# Patient Record
Sex: Male | Born: 1960
Health system: Southern US, Community
[De-identification: ages and names within clinical notes are randomized; demographics above are authoritative.]

## PROBLEM LIST (undated history)

## (undated) DIAGNOSIS — E119 Type 2 diabetes mellitus without complications: Secondary | ICD-10-CM

## (undated) DIAGNOSIS — R945 Abnormal results of liver function studies: Secondary | ICD-10-CM

## (undated) DIAGNOSIS — K5790 Diverticulosis of intestine, part unspecified, without perforation or abscess without bleeding: Secondary | ICD-10-CM

## (undated) DIAGNOSIS — F32A Depression, unspecified: Secondary | ICD-10-CM

## (undated) DIAGNOSIS — I10 Essential (primary) hypertension: Secondary | ICD-10-CM

## (undated) DIAGNOSIS — F329 Major depressive disorder, single episode, unspecified: Secondary | ICD-10-CM

## (undated) DIAGNOSIS — K219 Gastro-esophageal reflux disease without esophagitis: Secondary | ICD-10-CM

## (undated) DIAGNOSIS — K760 Fatty (change of) liver, not elsewhere classified: Secondary | ICD-10-CM

## (undated) DIAGNOSIS — K859 Acute pancreatitis without necrosis or infection, unspecified: Secondary | ICD-10-CM

## (undated) DIAGNOSIS — F419 Anxiety disorder, unspecified: Secondary | ICD-10-CM

## (undated) DIAGNOSIS — R7989 Other specified abnormal findings of blood chemistry: Secondary | ICD-10-CM

## (undated) HISTORY — DX: Major depressive disorder, single episode, unspecified: F32.9

## (undated) HISTORY — DX: Depression, unspecified: F32.A

## (undated) HISTORY — DX: Diverticulosis of intestine, part unspecified, without perforation or abscess without bleeding: K57.90

## (undated) HISTORY — DX: Other specified abnormal findings of blood chemistry: R79.89

## (undated) HISTORY — DX: Fatty (change of) liver, not elsewhere classified: K76.0

## (undated) HISTORY — DX: Type 2 diabetes mellitus without complications: E11.9

## (undated) HISTORY — DX: Anxiety disorder, unspecified: F41.9

## (undated) HISTORY — DX: Acute pancreatitis without necrosis or infection, unspecified: K85.90

## (undated) HISTORY — DX: Essential (primary) hypertension: I10

## (undated) HISTORY — DX: Gastro-esophageal reflux disease without esophagitis: K21.9

## (undated) HISTORY — DX: Abnormal results of liver function studies: R94.5

---

## 2004-10-23 ENCOUNTER — Ambulatory Visit: Payer: Self-pay | Admitting: Internal Medicine

## 2005-08-02 ENCOUNTER — Ambulatory Visit: Payer: Self-pay | Admitting: Internal Medicine

## 2005-08-13 ENCOUNTER — Ambulatory Visit: Payer: Self-pay | Admitting: Internal Medicine

## 2005-10-25 ENCOUNTER — Ambulatory Visit: Payer: Self-pay | Admitting: Internal Medicine

## 2005-10-29 ENCOUNTER — Ambulatory Visit: Payer: Self-pay | Admitting: Internal Medicine

## 2005-11-05 ENCOUNTER — Ambulatory Visit: Payer: Self-pay | Admitting: Gastroenterology

## 2005-11-12 ENCOUNTER — Ambulatory Visit: Payer: Self-pay | Admitting: Internal Medicine

## 2005-12-14 ENCOUNTER — Ambulatory Visit: Payer: Self-pay | Admitting: Internal Medicine

## 2006-01-30 ENCOUNTER — Ambulatory Visit: Payer: Self-pay | Admitting: Internal Medicine

## 2006-01-30 ENCOUNTER — Inpatient Hospital Stay (HOSPITAL_COMMUNITY): Admission: AD | Admit: 2006-01-30 | Discharge: 2006-02-02 | Payer: Self-pay | Admitting: Internal Medicine

## 2006-01-31 ENCOUNTER — Ambulatory Visit: Payer: Self-pay | Admitting: Internal Medicine

## 2006-02-05 ENCOUNTER — Ambulatory Visit: Payer: Self-pay | Admitting: Gastroenterology

## 2006-03-11 ENCOUNTER — Ambulatory Visit (HOSPITAL_COMMUNITY): Admission: RE | Admit: 2006-03-11 | Discharge: 2006-03-11 | Payer: Self-pay | Admitting: Gastroenterology

## 2006-03-18 ENCOUNTER — Ambulatory Visit: Payer: Self-pay | Admitting: Internal Medicine

## 2006-03-21 ENCOUNTER — Ambulatory Visit: Payer: Self-pay | Admitting: Internal Medicine

## 2006-06-25 DIAGNOSIS — K859 Acute pancreatitis without necrosis or infection, unspecified: Secondary | ICD-10-CM

## 2006-06-25 HISTORY — DX: Acute pancreatitis without necrosis or infection, unspecified: K85.90

## 2007-05-15 ENCOUNTER — Emergency Department (HOSPITAL_COMMUNITY): Admission: EM | Admit: 2007-05-15 | Discharge: 2007-05-15 | Payer: Self-pay | Admitting: Emergency Medicine

## 2007-07-18 ENCOUNTER — Ambulatory Visit: Payer: Self-pay | Admitting: Internal Medicine

## 2007-07-18 LAB — CONVERTED CEMR LAB
ALT: 31 units/L (ref 0–53)
AST: 32 units/L (ref 0–37)
Albumin: 4.3 g/dL (ref 3.5–5.2)
Amylase: 109 units/L (ref 27–131)
Calcium: 9.2 mg/dL (ref 8.4–10.5)
Chloride: 103 meq/L (ref 96–112)
Eosinophils Relative: 2.7 % (ref 0.0–5.0)
GFR calc non Af Amer: 111 mL/min
HCT: 41.7 % (ref 39.0–52.0)
Hemoglobin: 13.8 g/dL (ref 13.0–17.0)
Lymphocytes Relative: 23.9 % (ref 12.0–46.0)
MCV: 87.6 fL (ref 78.0–100.0)
Neutrophils Relative %: 67.4 % (ref 43.0–77.0)
Sodium: 140 meq/L (ref 135–145)
WBC: 13 10*3/uL — ABNORMAL HIGH (ref 4.5–10.5)

## 2007-09-08 ENCOUNTER — Ambulatory Visit: Payer: Self-pay | Admitting: Internal Medicine

## 2007-11-03 ENCOUNTER — Telehealth: Payer: Self-pay | Admitting: Internal Medicine

## 2007-11-06 ENCOUNTER — Ambulatory Visit: Payer: Self-pay | Admitting: Internal Medicine

## 2007-11-06 DIAGNOSIS — J309 Allergic rhinitis, unspecified: Secondary | ICD-10-CM | POA: Insufficient documentation

## 2007-11-06 DIAGNOSIS — K219 Gastro-esophageal reflux disease without esophagitis: Secondary | ICD-10-CM | POA: Insufficient documentation

## 2007-11-06 DIAGNOSIS — Z8719 Personal history of other diseases of the digestive system: Secondary | ICD-10-CM | POA: Insufficient documentation

## 2007-11-06 DIAGNOSIS — F411 Generalized anxiety disorder: Secondary | ICD-10-CM | POA: Insufficient documentation

## 2007-12-03 ENCOUNTER — Telehealth (INDEPENDENT_AMBULATORY_CARE_PROVIDER_SITE_OTHER): Payer: Self-pay | Admitting: *Deleted

## 2007-12-25 ENCOUNTER — Telehealth: Payer: Self-pay | Admitting: Internal Medicine

## 2007-12-29 ENCOUNTER — Telehealth: Payer: Self-pay | Admitting: Internal Medicine

## 2008-01-14 ENCOUNTER — Telehealth: Payer: Self-pay | Admitting: Internal Medicine

## 2008-01-16 ENCOUNTER — Ambulatory Visit: Payer: Self-pay | Admitting: Internal Medicine

## 2008-01-19 ENCOUNTER — Ambulatory Visit: Payer: Self-pay | Admitting: Cardiology

## 2008-01-19 LAB — CONVERTED CEMR LAB
ALT: 24 units/L (ref 0–53)
AST: 28 units/L (ref 0–37)
Albumin: 4.5 g/dL (ref 3.5–5.2)
Alkaline Phosphatase: 100 units/L (ref 39–117)
BUN: 8 mg/dL (ref 6–23)
Bilirubin Urine: NEGATIVE
Bilirubin, Direct: 0.1 mg/dL (ref 0.0–0.3)
CO2: 28 meq/L (ref 19–32)
Chloride: 101 meq/L (ref 96–112)
Glucose, Bld: 171 mg/dL — ABNORMAL HIGH (ref 70–99)
HCT: 41.2 % (ref 39.0–52.0)
HDL: 33 mg/dL — ABNORMAL LOW (ref >39.0)
Hemoglobin, Urine: NEGATIVE
Ketones, ur: NEGATIVE mg/dL
MCHC: 34.6 g/dL (ref 30.0–36.0)
MCV: 87.6 fL (ref 78.0–100.0)
Monocytes Relative: 7.1 % (ref 3.0–12.0)
Nitrite: NEGATIVE
Platelets: 240 10*3/uL (ref 150–400)
Potassium: 3.8 meq/L (ref 3.5–5.1)
RDW: 12.3 % (ref 11.5–14.6)
Total Protein, Urine: NEGATIVE mg/dL
Total Protein: 7.3 g/dL (ref 6.0–8.3)
Triglycerides: 192 mg/dL — ABNORMAL HIGH (ref 0–149)

## 2008-01-21 ENCOUNTER — Encounter: Payer: Self-pay | Admitting: Gastroenterology

## 2008-01-21 DIAGNOSIS — D490 Neoplasm of unspecified behavior of digestive system: Secondary | ICD-10-CM | POA: Insufficient documentation

## 2008-01-23 ENCOUNTER — Ambulatory Visit: Payer: Self-pay | Admitting: Internal Medicine

## 2008-01-23 LAB — CONVERTED CEMR LAB
Glucose, Bld: 114 mg/dL — ABNORMAL HIGH (ref 70–99)
Hgb A1c MFr Bld: 6 % (ref 4.6–6.0)

## 2008-02-02 ENCOUNTER — Telehealth (INDEPENDENT_AMBULATORY_CARE_PROVIDER_SITE_OTHER): Payer: Self-pay | Admitting: *Deleted

## 2008-02-05 ENCOUNTER — Encounter: Payer: Self-pay | Admitting: Gastroenterology

## 2008-02-05 ENCOUNTER — Ambulatory Visit (HOSPITAL_COMMUNITY): Admission: RE | Admit: 2008-02-05 | Discharge: 2008-02-05 | Payer: Self-pay | Admitting: Gastroenterology

## 2008-02-12 ENCOUNTER — Ambulatory Visit: Payer: Self-pay | Admitting: Gastroenterology

## 2008-02-20 ENCOUNTER — Ambulatory Visit: Payer: Self-pay | Admitting: Internal Medicine

## 2008-02-20 DIAGNOSIS — R109 Unspecified abdominal pain: Secondary | ICD-10-CM | POA: Insufficient documentation

## 2008-02-20 DIAGNOSIS — G4700 Insomnia, unspecified: Secondary | ICD-10-CM | POA: Insufficient documentation

## 2008-02-20 DIAGNOSIS — F17200 Nicotine dependence, unspecified, uncomplicated: Secondary | ICD-10-CM | POA: Insufficient documentation

## 2008-03-25 ENCOUNTER — Telehealth: Payer: Self-pay | Admitting: Internal Medicine

## 2008-03-30 ENCOUNTER — Ambulatory Visit: Payer: Self-pay | Admitting: Internal Medicine

## 2008-06-09 ENCOUNTER — Ambulatory Visit: Payer: Self-pay | Admitting: Internal Medicine

## 2008-06-09 LAB — CONVERTED CEMR LAB
CO2: 31 meq/L (ref 19–32)
Calcium: 9.6 mg/dL (ref 8.4–10.5)
Chloride: 105 meq/L (ref 96–112)
GFR calc non Af Amer: 110 mL/min
Hgb A1c MFr Bld: 5.8 % (ref 4.6–6.0)
Sodium: 141 meq/L (ref 135–145)

## 2008-06-14 ENCOUNTER — Ambulatory Visit: Payer: Self-pay | Admitting: Internal Medicine

## 2008-08-06 ENCOUNTER — Emergency Department (HOSPITAL_COMMUNITY): Admission: EM | Admit: 2008-08-06 | Discharge: 2008-08-06 | Payer: Self-pay | Admitting: Emergency Medicine

## 2008-09-02 ENCOUNTER — Ambulatory Visit: Payer: Self-pay | Admitting: Gastroenterology

## 2008-09-02 ENCOUNTER — Telehealth (INDEPENDENT_AMBULATORY_CARE_PROVIDER_SITE_OTHER): Payer: Self-pay | Admitting: *Deleted

## 2008-09-02 ENCOUNTER — Inpatient Hospital Stay (HOSPITAL_COMMUNITY): Admission: EM | Admit: 2008-09-02 | Discharge: 2008-09-04 | Payer: Self-pay | Admitting: Emergency Medicine

## 2008-09-10 ENCOUNTER — Encounter: Payer: Self-pay | Admitting: Internal Medicine

## 2008-09-13 ENCOUNTER — Ambulatory Visit (HOSPITAL_COMMUNITY): Admission: RE | Admit: 2008-09-13 | Discharge: 2008-09-13 | Payer: Self-pay | Admitting: Internal Medicine

## 2008-12-06 ENCOUNTER — Encounter: Payer: Self-pay | Admitting: Internal Medicine

## 2008-12-13 ENCOUNTER — Ambulatory Visit: Payer: Self-pay | Admitting: Internal Medicine

## 2008-12-13 ENCOUNTER — Telehealth: Payer: Self-pay | Admitting: Internal Medicine

## 2008-12-14 LAB — CONVERTED CEMR LAB
Basophils Absolute: 0 10*3/uL (ref 0.0–0.1)
Bilirubin, Direct: 0.1 mg/dL (ref 0.0–0.3)
CO2: 27 meq/L (ref 19–32)
Calcium: 9 mg/dL (ref 8.4–10.5)
Chloride: 107 meq/L (ref 96–112)
Cholesterol: 204 mg/dL — ABNORMAL HIGH (ref 0–200)
Creatinine, Ser: 0.8 mg/dL (ref 0.4–1.5)
Direct LDL: 143.3 mg/dL
Eosinophils Absolute: 0.2 10*3/uL (ref 0.0–0.7)
Eosinophils Relative: 2 % (ref 0.0–5.0)
HCT: 40 % (ref 39.0–52.0)
HDL: 44.7 mg/dL (ref >39.00)
Hgb A1c MFr Bld: 5.9 % (ref 4.6–6.5)
Ketones, ur: NEGATIVE mg/dL
Leukocytes, UA: NEGATIVE
Lymphs Abs: 3.4 10*3/uL (ref 0.7–4.0)
MCV: 86.7 fL (ref 78.0–100.0)
Monocytes Absolute: 0.8 10*3/uL (ref 0.1–1.0)
Neutrophils Relative %: 51.7 % (ref 43.0–77.0)
Nitrite: NEGATIVE
Platelets: 200 10*3/uL (ref 150.0–400.0)
RDW: 12.2 % (ref 11.5–14.6)
Sodium: 141 meq/L (ref 135–145)
Specific Gravity, Urine: 1.02 (ref 1.000–1.030)
TSH: 1.2 microintl units/mL (ref 0.35–5.50)
Total Bilirubin: 0.7 mg/dL (ref 0.3–1.2)
Triglycerides: 93 mg/dL (ref 0.0–149.0)
Urobilinogen, UA: 0.2 (ref 0.0–1.0)
WBC: 8.6 10*3/uL (ref 4.5–10.5)
pH: 6 (ref 5.0–8.0)

## 2008-12-20 ENCOUNTER — Ambulatory Visit: Payer: Self-pay | Admitting: Internal Medicine

## 2009-03-11 ENCOUNTER — Telehealth: Payer: Self-pay | Admitting: Internal Medicine

## 2009-04-27 ENCOUNTER — Encounter: Payer: Self-pay | Admitting: Internal Medicine

## 2009-07-04 ENCOUNTER — Ambulatory Visit: Payer: Self-pay | Admitting: Internal Medicine

## 2009-07-04 DIAGNOSIS — Z87891 Personal history of nicotine dependence: Secondary | ICD-10-CM | POA: Insufficient documentation

## 2009-09-07 ENCOUNTER — Ambulatory Visit: Payer: Self-pay | Admitting: Internal Medicine

## 2009-09-07 DIAGNOSIS — F418 Other specified anxiety disorders: Secondary | ICD-10-CM | POA: Insufficient documentation

## 2009-12-22 ENCOUNTER — Ambulatory Visit: Payer: Self-pay | Admitting: Internal Medicine

## 2009-12-22 ENCOUNTER — Inpatient Hospital Stay (HOSPITAL_COMMUNITY): Admission: EM | Admit: 2009-12-22 | Discharge: 2009-12-24 | Payer: Self-pay | Admitting: Emergency Medicine

## 2009-12-28 ENCOUNTER — Telehealth (INDEPENDENT_AMBULATORY_CARE_PROVIDER_SITE_OTHER): Payer: Self-pay | Admitting: *Deleted

## 2010-01-05 ENCOUNTER — Ambulatory Visit: Payer: Self-pay | Admitting: Internal Medicine

## 2010-01-05 LAB — CONVERTED CEMR LAB
ALT: 20 units/L (ref 0–53)
Basophils Relative: 1 % (ref 0.0–3.0)
Bilirubin Urine: NEGATIVE
Calcium: 8.9 mg/dL (ref 8.4–10.5)
Cholesterol: 157 mg/dL (ref 0–200)
Eosinophils Absolute: 0.2 10*3/uL (ref 0.0–0.7)
Eosinophils Relative: 2.6 % (ref 0.0–5.0)
GFR calc non Af Amer: 107.69 mL/min (ref >60)
Glucose, Bld: 106 mg/dL — ABNORMAL HIGH (ref 70–99)
HCT: 40.1 % (ref 39.0–52.0)
HDL: 34.1 mg/dL — ABNORMAL LOW (ref >39.00)
Hemoglobin, Urine: NEGATIVE
Ketones, ur: NEGATIVE mg/dL
Leukocytes, UA: NEGATIVE
Lymphs Abs: 2.4 10*3/uL (ref 0.7–4.0)
MCHC: 34.2 g/dL (ref 30.0–36.0)
MCV: 88.8 fL (ref 78.0–100.0)
Monocytes Absolute: 0.6 10*3/uL (ref 0.1–1.0)
PSA: 0.36 ng/mL (ref 0.10–4.00)
Platelets: 246 10*3/uL (ref 150.0–400.0)
Potassium: 4.8 meq/L (ref 3.5–5.1)
Sodium: 137 meq/L (ref 135–145)
TSH: 1.56 microintl units/mL (ref 0.35–5.50)
Total Protein: 7 g/dL (ref 6.0–8.3)
Urobilinogen, UA: 0.2 (ref 0.0–1.0)
WBC: 7 10*3/uL (ref 4.5–10.5)

## 2010-01-11 ENCOUNTER — Ambulatory Visit: Payer: Self-pay | Admitting: Internal Medicine

## 2010-02-15 ENCOUNTER — Telehealth: Payer: Self-pay | Admitting: Internal Medicine

## 2010-05-01 ENCOUNTER — Encounter: Payer: Self-pay | Admitting: Internal Medicine

## 2010-06-06 ENCOUNTER — Telehealth: Payer: Self-pay | Admitting: Internal Medicine

## 2010-06-25 HISTORY — PX: ESOPHAGOGASTRODUODENOSCOPY ENDOSCOPY: SHX5814

## 2010-07-16 ENCOUNTER — Encounter: Payer: Self-pay | Admitting: Internal Medicine

## 2010-07-17 ENCOUNTER — Ambulatory Visit
Admission: RE | Admit: 2010-07-17 | Discharge: 2010-07-17 | Payer: Self-pay | Source: Home / Self Care | Attending: Internal Medicine | Admitting: Internal Medicine

## 2010-07-27 NOTE — Assessment & Plan Note (Signed)
Summary: DEPRESSION--DR AVP PT/NOT IN CLINIC  STC   Vital Signs:  Patient profile:   50 year old male Height:      71 inches Weight:      173 pounds BMI:     24.22 O2 Sat:      97 % on Room air Temp:     98.3 degrees F oral Pulse rate:   71 / minute Pulse rhythm:   regular Resp:     16 per minute BP sitting:   120 / 84  (left arm) Cuff size:   large  Vitals Entered By: Rock Nephew CMA (September 07, 2009 1:18 PM)  O2 Flow:  Room air  Primary Care Provider:  Tresa Garter MD  CC:  Depressive symptoms.  History of Present Illness:  Depressive Symptoms      This is a 50 year old man who presents with Depressive symptoms.  The symptoms began 3 months ago.  The severity is described as mild.  The patient reports depressed mood, loss of interest/pleasure, and insomnia, but denies significant weight loss, significant weight gain, hypersomnia, psychomotor agitation, and psychomotor retardation.  The patient also reports fatigue or loss of energy.  The patient denies feelings of worthlessness, diminished concentration, indecisiveness, thoughts of death, thoughts of suicide, suicidal intent, and suicidal plans.  The patient reports the following psychosocial stressors: recent death of a loved one.  Patient's past history includes depression.  The patient denies abnormally elevated mood, abnormally irritable mood, decreased need for sleep, increased talkativeness, distractibility, flight of ideas, increased goal-directed activity, and inflated self-esteem/ grandiosity.    Preventive Screening-Counseling & Management  Alcohol-Tobacco     Alcohol drinks/day: 0     Smoking Status: quit     Packs/Day: 10-12  Caffeine-Diet-Exercise     Does Patient Exercise: yes  Hep-HIV-STD-Contraception     Hepatitis Risk: no risk noted     HIV Risk: no risk noted     STD Risk: no risk noted      Sexual History:  currently monogamous.        Drug Use:  never.        Blood Transfusions:  no.      Medications Prior to Update: 1)  Xanax 0.5 Mg  Tabs (Alprazolam) .... 1/2 or 1 Tab By Mouth Two Times A Day As Needed Anxiety 2)  Temazepam 30 Mg Caps (Temazepam) .[name].. 1 By Mouth At Bedtime As Needed Insomnia 3)  Vitamin D3 1000 Unit  Tabs (Cholecalciferol) .[name].. 1 By Mouth Daily 4)  Hydrocodone-Acetaminophen 5-500 Mg Tabs (Hydrocodone-Acetaminophen) .[name].. 1-2 Tablets By Mouth Q 4-6 Hours As Needed Pain 5)  Zyrtec Allergy 10 Mg Tabs (Cetirizine Hcl) .... Once Daily 6)  Pancreaze 16109 Unit Cpep (Pancrelipase (Lip-Prot-Amyl)) .[name].. 1-2 By Mouth Three Times A Day W/meals 7)  Omeprazole 40 Mg Cpdr (Omeprazole) .[name].. 1 By Mouth Qam For Indigestion Prn  Current Medications (verified): 1)  Xanax 0.5 Mg  Tabs (Alprazolam) .... 1/2 or 1 Tab By Mouth Two Times A Day As Needed Anxiety 2)  Temazepam 30 Mg Caps (Temazepam) .[name].. 1 By Mouth At Bedtime As Needed Insomnia 3)  Vitamin D3 1000 Unit  Tabs (Cholecalciferol) .[name].. 1 By Mouth Daily 4)  Hydrocodone-Acetaminophen 5-500 Mg Tabs (Hydrocodone-Acetaminophen) .[name].. 1-2 Tablets By Mouth Q 4-6 Hours As Needed Pain 5)  Zyrtec Allergy 10 Mg Tabs (Cetirizine Hcl) .... Once Daily 6)  Pancreaze 60454 Unit Cpep (Pancrelipase (Lip-Prot-Amyl)) .[name].. 1-2 By Mouth Three Times A Day W/meals As  Needed 7)  Omeprazole 40 Mg Cpdr (Omeprazole) .[name].. 1 By Mouth Qam For Indigestion Prn 8)  Paroxetine Hcl 20 Mg Tabs (Paroxetine Hcl) .... One By Mouth Qday For Depression  Allergies (verified): No Known Drug Allergies  Past History:  Past Medical History: Reviewed history from 11/06/2007 and no changes required. Pancreatitis, hx of x2  2008  ? cause Allergic rhinitis Anxiety GERD  Past Surgical History: Denies surgical history  Family History: Reviewed history from 11/06/2007 and no changes required. Family History of Anxiety No CAD  Social History: Reviewed history from 11/06/2007 and no changes required. Occupation: Producer, television/film/video Alcohol  use-no Divorced 2 daughters Regular exercise-yes Hepatitis Risk:  no risk noted HIV Risk:  no risk noted STD Risk:  no risk noted Sexual History:  currently monogamous Drug Use:  never Blood Transfusions:  no Does Patient Exercise:  yes  Review of Systems  The patient denies anorexia, fever, weight loss, chest pain, peripheral edema, prolonged cough, and abdominal pain.   GI:  Denies abdominal pain, change in bowel habits, loss of appetite, nausea, and vomiting.  Physical Exam  General:  Well-developed,well-nourished,in no acute distress; alert,appropriate and cooperative throughout examination Head:  normocephalic, atraumatic, no abnormalities observed, and no abnormalities palpated.   Eyes:  no icterus Mouth:  Oral mucosa and oropharynx without lesions or exudates.  Teeth in good repair. Neck:  supple, full ROM, no masses, no thyromegaly, no thyroid nodules or tenderness, and no JVD.   Lungs:  Normal respiratory effort, chest expands symmetrically. Lungs are clear to auscultation, no crackles or wheezes. Heart:  Normal rate and regular rhythm. S1 and S2 normal without gallop, murmur, click, rub or other extra sounds. Abdomen:  Bowel sounds positive,abdomen soft and non-tender without masses, organomegaly or hernias noted. Msk:  No deformity or scoliosis noted of thoracic or lumbar spine.   Extremities:  No clubbing, cyanosis, edema, or deformity noted with normal full range of motion of all joints.   Neurologic:  No cranial nerve deficits noted. Station and gait are normal. Plantar reflexes are down-going bilaterally. DTRs are symmetrical throughout. Sensory, motor and coordinative functions appear intact. Skin:  Intact without suspicious lesions or rashes Cervical Nodes:  no anterior cervical adenopathy and no posterior cervical adenopathy.   Axillary Nodes:  no R axillary adenopathy and no L axillary adenopathy.   Inguinal Nodes:  no R inguinal adenopathy and no L inguinal  adenopathy.   Psych:  Oriented X3, memory intact for recent and remote, normally interactive, good eye contact, not anxious appearing, not agitated, not suicidal, not homicidal, and flat affect.     Impression & Recommendations:  Problem # 1:  DEPRESSIVE DISORDER (ICD-311) Assessment New  he will try to taper his use of benzo's and opioids as these may be contributing to the depression, if needed for insomnia i would add seroquel at hs , he will continue psychotherapy with Dr. Charise Killian His updated medication list for this problem includes:    Xanax 0.5 Mg Tabs (Alprazolam) .[name]... 1/2 or 1 tab by mouth two times a day as needed anxiety    Paroxetine Hcl 20 Mg Tabs (Paroxetine hcl) ..... One by mouth qday for depression  Discussed treatment options, including trial of antidpressant medication. Will refer to behavioral health. Follow-up call in in 24-48 hours and recheck in 2 weeks, sooner as needed. Patient agrees to call if any worsening of symptoms or thoughts of doing harm arise. Verified that the patient has no suicidal ideation  at this time.   Problem # 2:  PANCREATITIS, HX OF (ICD-V12.70) Assessment: Improved  Complete Medication List: 1)  Xanax 0.5 Mg Tabs (Alprazolam) .... 1/2 or 1 tab by mouth two times a day as needed anxiety 2)  Temazepam 30 Mg Caps (Temazepam) .[name].. 1 by mouth at bedtime as needed insomnia 3)  Vitamin D3 1000 Unit Tabs (Cholecalciferol) .[name].. 1 by mouth daily 4)  Hydrocodone-acetaminophen 5-500 Mg Tabs (Hydrocodone-acetaminophen) .[name].. 1-2 tablets by mouth q 4-6 hours as needed pain 5)  Zyrtec Allergy 10 Mg Tabs (Cetirizine hcl) .... Once daily 6)  Pancreaze 84696 Unit Cpep (Pancrelipase (lip-prot-amyl)) .[name].. 1-2 by mouth three times a day w/meals as needed 7)  Omeprazole 40 Mg Cpdr (Omeprazole) .[name].. 1 by mouth qam for indigestion prn 8)  Paroxetine Hcl 20 Mg Tabs (Paroxetine hcl) .... One by mouth qday for depression  Patient Instructions: 1)  Please  schedule a follow-up appointment in 1 month. Please try to taper off the xanax and temazepam.  Prescriptions: PAROXETINE HCL 20 MG TABS (PAROXETINE HCL) One by mouth qday for depression  #30 x 11   Entered and Authorized by:   Etta Grandchild MD   Signed by:   Etta Grandchild MD on 09/07/2009   Method used:   Electronically to        [facility] [address] [phone]* (retail)       [address]       [place], [place]  [address]       Ph: [phone] or [phone]       Fax: [phone]   RxID:   [id number]

## 2010-07-27 NOTE — Assessment & Plan Note (Signed)
Summary: 6 mos f/u #/cd   Vital Signs:  Patient profile:   50 year old male Height:      71 inches Weight:      173 pounds BMI:     24.22 Temp:     98.6 degrees F oral Pulse rate:   80 / minute Pulse rhythm:   regular Resp:     16 per minute BP sitting:   120 / 90  (left arm) Cuff size:   regular  Vitals Entered By: Lanier Prude, CMA(AAMA) (July 17, 2010 9:56 AM) CC: 6 mo f/u  Is Patient Diabetic? No   Primary Care Provider:  Tresa Garter MD  CC:  6 mo f/u .  History of Present Illness: The patient presents for a follow up of pancreatitis, anxiety, smoking, insomnia. Enzymes help. Started to smoke - now and quit   Current Medications (verified): 1)  Xanax 0.5 Mg  Tabs (Alprazolam) .... 1/2 or 1 Tab By Mouth Two Times A Day As Needed Anxiety 2)  Temazepam 30 Mg Caps (Temazepam) .[name].. 1 By Mouth At Bedtime As Needed Insomnia 3)  Vitamin D3 1000 Unit  Tabs (Cholecalciferol) .[name].. 1 By Mouth Daily 4)  Hydrocodone-Acetaminophen 5-500 Mg Tabs (Hydrocodone-Acetaminophen) .[name].. 1-2 Tablets By Mouth Q 4-6 Hours As Needed Pain 5)  Zyrtec Allergy 10 Mg Tabs (Cetirizine Hcl) .... Once Daily 6)  Omeprazole 40 Mg Cpdr (Omeprazole) .[name].. 1 By Mouth Qam For Indigestion Prn 7)  Buspirone Hcl 15 Mg Tabs (Buspirone Hcl) .... Start With 1/2 Tab Two Times A Day Then 1 By Mouth Two Times A Day For Anxiety 8)  Promethazine Hcl 25 Mg Tabs (Promethazine Hcl) .[name].. 1-2 By Mouth Four Times A Day As Needed Nausea 9)  Zenpep 10000 Unit Cpep (Pancrelipase (Lip-Prot-Amyl)) .[name].. 1 By Mouth Three Times A Day Ac 10)  Chantix Continuing Month Pak 1 Mg Tabs (Varenicline Tartrate) .... As Dirrected  Allergies (verified): No Known Drug Allergies  Past History:  Past Medical History: Last updated: 11/06/2007 Pancreatitis, hx of x2  2008  ? cause Allergic rhinitis Anxiety GERD  Social History: Last updated: 09/07/2009 Occupation: [employer] Alcohol use-no Divorced  2 daughters Regular exercise-yes  Review of Systems  The patient denies anorexia, fever, weight loss, weight gain, vision loss, decreased hearing, hoarseness, chest pain, syncope, dyspnea on exertion, peripheral edema, prolonged cough, headaches, hemoptysis, abdominal pain, melena, hematochezia, severe indigestion/heartburn, hematuria, incontinence, genital sores, muscle weakness, suspicious skin lesions, transient blindness, difficulty walking, depression, unusual weight change, abnormal bleeding, enlarged lymph nodes, angioedema, and testicular masses.    Physical Exam  General:  Well-developed,well-nourished,in no acute distress; alert,appropriate and cooperative throughout examination Head:  normocephalic, atraumatic, no abnormalities observed, and no abnormalities palpated.   Mouth:  Oral mucosa and oropharynx without lesions or exudates.  Teeth in good repair. Neck:  No deformities, masses, or tenderness noted. Lungs:  Normal respiratory effort, chest expands symmetrically. Lungs are clear to auscultation, no crackles or wheezes. Heart:  Normal rate and regular rhythm. S1 and S2 normal without gallop, murmur, click, rub or other extra sounds. Abdomen:  Bowel sounds positive,abdomen soft and non-tender without masses, organomegaly or hernias noted. Msk:  No deformity or scoliosis noted of thoracic or lumbar spine.   Neurologic:  No cranial nerve deficits noted. Station and gait are normal. Plantar reflexes are down-going bilaterally. DTRs are symmetrical throughout. Sensory, motor and coordinative functions appear intact. Skin:  Intact without suspicious lesions or rashes Psych:  Cognition and judgment  appear intact. Alert and cooperative with normal attention span and concentration. No apparent delusions, illusions, hallucinations   Impression & Recommendations:  Problem # 1:  ANXIETY (ICD-300.00) Assessment Improved  His updated medication list for this problem includes:    Xanax  0.5 Mg Tabs (Alprazolam) .[name]... 1/2 or 1 tab by mouth two times a day as needed anxiety    Buspirone Hcl 15 Mg Tabs (Buspirone hcl) ..... Start with 1/2 tab two times a day then 1 by mouth two times a day for anxiety  Problem # 2:  TOBACCO USE, QUIT (ICD-V15.82) Assessment: Improved Chantix to cont  Problem # 3:  DEPRESSIVE DISORDER (ICD-311) Assessment: Improved  His updated medication list for this problem includes:    Xanax 0.5 Mg Tabs (Alprazolam) .[name]... 1/2 or 1 tab by mouth two times a day as needed anxiety    Buspirone Hcl 15 Mg Tabs (Buspirone hcl) ..... Start with 1/2 tab two times a day then 1 by mouth two times a day for anxiety  Problem # 4:  PANCREATITIS, HX OF (ICD-V12.70) Assessment: Improved Cont on enzymes  Complete Medication List: 1)  Xanax 0.5 Mg Tabs (Alprazolam) .... 1/2 or 1 tab by mouth two times a day as needed anxiety 2)  Temazepam 30 Mg Caps (Temazepam) .[name].. 1 by mouth at bedtime as needed insomnia 3)  Vitamin D3 1000 Unit Tabs (Cholecalciferol) .[name].. 1 by mouth daily 4)  Hydrocodone-acetaminophen 5-500 Mg Tabs (Hydrocodone-acetaminophen) .[name].. 1-2 tablets by mouth q 4-6 hours as needed pain 5)  Zyrtec Allergy 10 Mg Tabs (Cetirizine hcl) .... Once daily 6)  Omeprazole 40 Mg Cpdr (Omeprazole) .[name].. 1 by mouth qam for indigestion prn 7)  Buspirone Hcl 15 Mg Tabs (Buspirone hcl) .... Start with 1/2 tab two times a day then 1 by mouth two times a day for anxiety 8)  Promethazine Hcl 25 Mg Tabs (Promethazine hcl) .[name].. 1-2 by mouth four times a day as needed nausea 9)  Zenpep 10000 Unit Cpep (Pancrelipase (lip-prot-amyl)) .[name].. 1 by mouth three times a day ac 10)  Chantix Continuing Month Pak 1 Mg Tabs (Varenicline tartrate) .... As dirrected  Patient Instructions: 1)  Please schedule a follow-up appointment in 6 months well w/labs. Prescriptions: OMEPRAZOLE 40 MG CPDR (OMEPRAZOLE) 1 by mouth qam for indigestion prn  #90 x 3   Entered and Authorized by:   Tresa Garter MD   Signed by:   Tresa Garter MD on 07/17/2010   Method used:   Print then Give to Patient   RxID:   [id number] ZENPEP 10000 UNIT CPEP (PANCRELIPASE (LIP-PROT-AMYL)) 1 by mouth three times a day ac  #120 x 11   Entered and Authorized by:   Tresa Garter MD   Signed by:   Tresa Garter MD on 07/17/2010   Method used:   Print then Give to Patient   RxID:   [id number] BUSPIRONE HCL 15 MG TABS (BUSPIRONE HCL) Start with 1/2 tab two times a day then 1 by mouth two times a day for anxiety  #180 x 1   Entered and Authorized by:   Tresa Garter MD   Signed by:   Tresa Garter MD on 07/17/2010   Method used:   Print then Give to Patient   RxID:   [id number] TEMAZEPAM 30 MG CAPS (TEMAZEPAM) 1 by mouth at bedtime as needed insomnia  #90 x 1   Entered and Authorized by:   Tresa Garter MD  Signed by:   Tresa Garter MD on 07/17/2010   Method used:   Print then Give to Patient   RxID:   [id number] XANAX 0.5 MG  TABS (ALPRAZOLAM) 1/2 or 1 tab by mouth two times a day as needed anxiety  #180 x 1   Entered and Authorized by:   Tresa Garter MD   Signed by:   Tresa Garter MD on 07/17/2010   Method used:   Print then Give to Patient   RxID:   [id number]    Orders Added: 1)  Est. Patient Level IV [84696]

## 2010-07-27 NOTE — Miscellaneous (Signed)
  Clinical Lists Changes  Observations: Added new observation of FLU VAX: Historical (04/27/2010 16:40)        Immunization History:  Influenza Immunization History:    Influenza:  historical (04/27/2010)

## 2010-07-27 NOTE — Progress Notes (Signed)
Summary: rx request  Phone Note Call from Patient Call back at [phone]   Summary of Call: Patient called c/o head cold/sinus infection and would like to know if ABX can sent to his pharmacy. He uses [facility] on [address]. Please advise. Initial call taken by: Lucious Groves CMA,  February 15, 2010 11:37 AM  Follow-up for Phone Call        Patient called back requesting the status of his request, I made him aware that our office would call him once the MD has made his recommendation. Follow-up by: Lucious Groves CMA,  February 15, 2010 4:36 PM  Additional Follow-up for Phone Call Additional follow up Details #1::        ok Zpac OV if sick Additional Follow-up by: Tresa Garter MD,  February 15, 2010 5:59 PM    Additional Follow-up for Phone Call Additional follow up Details #2::    pt informed Follow-up by: Margaret Pyle, CMA,  February 16, 2010 8:39 AM  New/Updated Medications: ZITHROMAX Z-PAK 250 MG TABS (AZITHROMYCIN) as dirrected Prescriptions: ZITHROMAX Z-PAK 250 MG TABS (AZITHROMYCIN) as dirrected  #1 x 0   Entered and Authorized by:   Tresa Garter MD   Signed by:   Tresa Garter MD on 02/15/2010   Method used:   Electronically to        [facility] [address] [phone]* (retail)       [address]       [place], [place]  [address]       Ph: [phone] or [phone]       Fax: [phone]   RxID:   [id number]

## 2010-07-27 NOTE — Progress Notes (Signed)
Summary: Alprazolam Rf  Phone Note Refill Request Message from:  Pharmacy  Refills Requested: Medication #1:  XANAX 0.5 MG  TABS 1/2 or 1 tab by mouth two times a day as needed anxiety   Dosage confirmed as above?Dosage Confirmed   Supply Requested: 60   Last Refilled: 05/01/2010  Method Requested: Telephone to Pharmacy Next Appointment Scheduled: 07/17/10 Initial call taken by: Lanier Prude, [facility]),  June 06, 2010 9:45 AM  Follow-up for Phone Call        ok 3 ref Follow-up by: Tresa Garter MD,  June 06, 2010 4:04 PM  Additional Follow-up for Phone Call Additional follow up Details #1::        Rx called to pharmacy Additional Follow-up by: Lanier Prude, [facility]),  June 07, 2010 1:42 PM    Prescriptions: Prudy Feeler 0.5 MG  TABS (ALPRAZOLAM) 1/2 or 1 tab by mouth two times a day as needed anxiety  #60 x 2   Entered by:   Lanier Prude, [facility])   Authorized by:   Tresa Garter MD   Signed by:   Lanier Prude, CMA(AAMA) on 06/07/2010   Method used:   Telephoned to ...       [facility] [address] [phone]* (retail)       [address]       [place], [place]  [address]       Ph: [phone] or [phone]       Fax: [phone]   RxID:   [id number]

## 2010-07-27 NOTE — Assessment & Plan Note (Signed)
Summary: cpx/bcbx/#/cd   Vital Signs:  Patient profile:   50 year old male Height:      71 inches (180.34 cm) Weight:      166 pounds (75.45 kg) BMI:     23.24 O2 Sat:      97 % on Room air Temp:     97.5 degrees F (36.39 degrees C) oral Pulse rate:   57 / minute Pulse rhythm:   regular Resp:     16 per minute BP sitting:   100 / 70  (left arm) Cuff size:   regular  Vitals Entered By: Lanier Prude, CMA(AAMA) (January 11, 2010 10:33 AM)  O2 Flow:  Room air CC: CPX  Is Patient Diabetic? No Comments pt is not taking Zyrtec, Pancreaze, Omeprazole or Paroxetine   Primary Care Provider:  Tresa Garter MD  CC:  CPX .  History of Present Illness: The patient presents for a wellness examination   Current Medications (verified): 1)  Xanax 0.5 Mg  Tabs (Alprazolam) .... 1/2 or 1 Tab By Mouth Two Times A Day As Needed Anxiety 2)  Temazepam 30 Mg Caps (Temazepam) .[name].. 1 By Mouth At Bedtime As Needed Insomnia 3)  Vitamin D3 1000 Unit  Tabs (Cholecalciferol) .[name].. 1 By Mouth Daily 4)  Hydrocodone-Acetaminophen 5-500 Mg Tabs (Hydrocodone-Acetaminophen) .[name].. 1-2 Tablets By Mouth Q 4-6 Hours As Needed Pain 5)  Zyrtec Allergy 10 Mg Tabs (Cetirizine Hcl) .... Once Daily 6)  Pancreaze 40981 Unit Cpep (Pancrelipase (Lip-Prot-Amyl)) .[name].. 1-2 By Mouth Three Times A Day W/meals As Needed 7)  Omeprazole 40 Mg Cpdr (Omeprazole) .[name].. 1 By Mouth Qam For Indigestion Prn 8)  Paroxetine Hcl 20 Mg Tabs (Paroxetine Hcl) .... One By Mouth Qday For Depression  Allergies (verified): No Known Drug Allergies  Past History:  Past Medical History: Last updated: 11/06/2007 Pancreatitis, hx of x2  2008  ? cause Allergic rhinitis Anxiety GERD  Past Surgical History: Last updated: 09/07/2009 Denies surgical history  Family History: Last updated: 11/06/2007 Family History of Anxiety No CAD  Social History: Last updated: 09/07/2009 Occupation: Scientist, research (medical) Domestic Partner Michael Alcohol  use-no Divorced 2 daughters Regular exercise-yes  Review of Systems       The patient complains of abdominal pain.  The patient denies anorexia, fever, weight loss, weight gain, vision loss, decreased hearing, hoarseness, chest pain, syncope, dyspnea on exertion, peripheral edema, prolonged cough, headaches, hemoptysis, melena, hematochezia, severe indigestion/heartburn, hematuria, incontinence, genital sores, muscle weakness, suspicious skin lesions, transient blindness, difficulty walking, depression, unusual weight change, abnormal bleeding, enlarged lymph nodes, angioedema, and testicular masses.    Physical Exam  General:  Well-developed,well-nourished,in no acute distress; alert,appropriate and cooperative throughout examination Head:  normocephalic, atraumatic, no abnormalities observed, and no abnormalities palpated.   Eyes:  no icterus Ears:  External ear exam shows no significant lesions or deformities.  Otoscopic examination reveals clear canals, tympanic membranes are intact bilaterally without bulging, retraction, inflammation or discharge. Hearing is grossly normal bilaterally. Nose:  External nasal examination shows no deformity or inflammation. Nasal mucosa are pink and moist without lesions or exudates. Mouth:  Oral mucosa and oropharynx without lesions or exudates.  Teeth in good repair. Neck:  No deformities, masses, or tenderness noted. Chest Wall:  No deformities, masses, tenderness or gynecomastia noted. Lungs:  Normal respiratory effort, chest expands symmetrically. Lungs are clear to auscultation, no crackles or wheezes. Heart:  Normal rate and regular rhythm. S1 and S2 normal without gallop, murmur, click, rub or other extra sounds.  Abdomen:  Bowel sounds positive,abdomen soft and non-tender without masses, organomegaly or hernias noted. Genitalia:  Testes bilaterally descended without nodularity, tenderness or masses. No scrotal masses or lesions. No penis lesions or  urethral discharge. Msk:  No deformity or scoliosis noted of thoracic or lumbar spine.   Pulses:  R and L carotid,radial,femoral,dorsalis pedis and posterior tibial pulses are full and equal bilaterally Extremities:  No clubbing, cyanosis, edema, or deformity noted with normal full range of motion of all joints.   Neurologic:  No cranial nerve deficits noted. Station and gait are normal. Plantar reflexes are down-going bilaterally. DTRs are symmetrical throughout. Sensory, motor and coordinative functions appear intact. Skin:  Intact without suspicious lesions or rashes Cervical Nodes:  No lymphadenopathy noted Inguinal Nodes:  No significant adenopathy Psych:  Cognition and judgment appear intact. Alert and cooperative with normal attention span and concentration. No apparent delusions, illusions, hallucinations   Impression & Recommendations:  Problem # 1:  HEALTH MAINTENANCE EXAM (ICD-V70.0) Assessment New Health and age related issues were discussed. Available screening tests and vaccinations were discussed as well. Healthy life style including good diet and execise was discussed.  The labs were reviewed with the patient.  Orders: EKG w/ Interpretation (93000)WNL  Problem # 2:  TOBACCO USE, QUIT (ICD-V15.82) Assessment: Comment Only Quit again  Problem # 3:  PANCREATITIS, HX OF (ICD-V12.70) Assessment: Unchanged  Complete Medication List: 1)  Xanax 0.5 Mg Tabs (Alprazolam) .... 1/2 or 1 tab by mouth two times a day as needed anxiety 2)  Temazepam 30 Mg Caps (Temazepam) .[name].. 1 by mouth at bedtime as needed insomnia 3)  Vitamin D3 1000 Unit Tabs (Cholecalciferol) .[name].. 1 by mouth daily 4)  Hydrocodone-acetaminophen 5-500 Mg Tabs (Hydrocodone-acetaminophen) .[name].. 1-2 tablets by mouth q 4-6 hours as needed pain 5)  Zyrtec Allergy 10 Mg Tabs (Cetirizine hcl) .... Once daily 6)  Omeprazole 40 Mg Cpdr (Omeprazole) .[name].. 1 by mouth qam for indigestion prn 7)  Buspirone Hcl 15 Mg Tabs  (Buspirone hcl) .... Start with 1/2 tab two times a day then 1 by mouth two times a day for anxiety 8)  Promethazine Hcl 25 Mg Tabs (Promethazine hcl) .[name].. 1-2 by mouth four times a day as needed nausea 9)  Zenpep 10000 Unit Cpep (Pancrelipase (lip-prot-amyl)) .[name].. 1 by mouth three times a day ac 10)  Chantix Continuing Month Pak 1 Mg Tabs (Varenicline tartrate) .... As dirrected  Patient Instructions: 1)  Please schedule a follow-up appointment in 6 months. Prescriptions: OMEPRAZOLE 40 MG CPDR (OMEPRAZOLE) 1 by mouth qam for indigestion prn  #90 x 3   Entered and Authorized by:   Tresa Garter MD   Signed by:   Tresa Garter MD on 01/11/2010   Method used:   Electronically to        [facility] [address] [phone]* (retail)       [address]       [place], [place]  [address]       Ph: [phone] or [phone]       Fax: [phone]   RxID:   [id number] CHANTIX CONTINUING MONTH PAK 1 MG TABS (VARENICLINE TARTRATE) as dirrected  #1 x 5   Entered and Authorized by:   Tresa Garter MD   Signed by:   Tresa Garter MD on 01/11/2010   Method used:   Print then Give to Patient   RxID:   [id number] HYDROCODONE-ACETAMINOPHEN  5-500 MG TABS (HYDROCODONE-ACETAMINOPHEN) 1-2 tablets by mouth q 4-6 hours as needed pain  #60 x 3   Entered and Authorized by:   Tresa Garter MD   Signed by:   Tresa Garter MD on 01/11/2010   Method used:   Print then Give to Patient   RxID:   [id number] TEMAZEPAM 30 MG CAPS (TEMAZEPAM) 1 by mouth at bedtime as needed insomnia  #30 x 6   Entered and Authorized by:   Tresa Garter MD   Signed by:   Tresa Garter MD on 01/11/2010   Method used:   Print then Give to Patient   RxID:   [id number] XANAX 0.5 MG  TABS (ALPRAZOLAM) 1/2 or 1 tab by mouth two times a day as needed anxiety  #60 x 3   Entered and Authorized by:   Tresa Garter MD   Signed by:   Tresa Garter MD  on 01/11/2010   Method used:   Print then Give to Patient   RxID:   [id number] ZENPEP 10000 UNIT CPEP (PANCRELIPASE (LIP-PROT-AMYL)) 1 by mouth three times a day ac  #90 x 12   Entered and Authorized by:   Tresa Garter MD   Signed by:   Tresa Garter MD on 01/11/2010   Method used:   Print then Give to Patient   RxID:   [id number] PROMETHAZINE HCL 25 MG TABS (PROMETHAZINE HCL) 1-2 by mouth four times a day as needed nausea  #60 x 1   Entered and Authorized by:   Tresa Garter MD   Signed by:   Tresa Garter MD on 01/11/2010   Method used:   Electronically to        [facility] [address] [phone]* (retail)       [address]       [place], [place]  [address]       Ph: [phone] or [phone]       Fax: [phone]   RxID:   [id number] BUSPIRONE HCL 15 MG TABS (BUSPIRONE HCL) Start with 1/2 tab two times a day then 1 by mouth two times a day for anxiety  #60 x 6   Entered and Authorized by:   Tresa Garter MD   Signed by:   Tresa Garter MD on 01/11/2010   Method used:   Electronically to        [facility] [address] [phone]* (retail)       [address]       [place], [place]  [address]       Ph: [phone] or [phone]       Fax: [phone]   RxID:   [id number]

## 2010-07-27 NOTE — Assessment & Plan Note (Signed)
Summary: 6 MO ROV /NWS $50   Vital Signs:  Patient profile:   50 year old male Weight:      174 pounds Temp:     98.4 degrees F oral Pulse rate:   95 / minute BP sitting:   120 / 86  (left arm)  Vitals Entered By: Tora Perches (July 04, 2009 9:56 AM) CC: f/u Is Patient Diabetic? No   CC:  f/u.  History of Present Illness: F/u pancreatitis, abd pain, anxiety. Stomach hurts after fatty meals  Preventive Screening-Counseling & Management  Alcohol-Tobacco     Smoking Status: quit  Current Medications (verified): 1)  Xanax 0.5 Mg  Tabs (Alprazolam) .... 1/2 or 1 Tab By Mouth Two Times A Day As Needed Anxiety 2)  Temazepam 30 Mg Caps (Temazepam) .[name].. 1 By Mouth At Bedtime As Needed Insomnia 3)  Vitamin D3 1000 Unit  Tabs (Cholecalciferol) .[name].. 1 By Mouth Daily 4)  Hydrocodone-Acetaminophen 5-500 Mg Tabs (Hydrocodone-Acetaminophen) .[name].. 1-2 Tablets By Mouth Q 4-6 Hours As Needed Pain 5)  Zyrtec Allergy 10 Mg Tabs (Cetirizine Hcl) .... Once Daily  Allergies (verified): No Known Drug Allergies  Past History:  Past Medical History: Last updated: 11/06/2007 Pancreatitis, hx of x2  2008  ? cause Allergic rhinitis Anxiety GERD  Family History: Last updated: 11/06/2007 Family History of Anxiety No CAD  Social History: Last updated: 11/06/2007 Occupation: Scientist, research (medical) Domestic Partner Michael Current Smoker Alcohol use-no Divorced 2 daughters  Social History: Smoking Status:  quit  Physical Exam  General:  Well-developed,well-nourished,in no acute distress; alert,appropriate and cooperative throughout examination Nose:  External nasal examination shows no deformity or inflammation. Nasal mucosa are pink and moist without lesions or exudates. Mouth:  Oral mucosa and oropharynx without lesions or exudates.  Teeth in good repair. Lungs:  Normal respiratory effort, chest expands symmetrically. Lungs are clear to auscultation, no crackles or wheezes. Heart:  Normal  rate and regular rhythm. S1 and S2 normal without gallop, murmur, click, rub or other extra sounds. Abdomen:  Bowel sounds positive,abdomen soft and non-tender without masses, organomegaly or hernias noted. Msk:  No deformity or scoliosis noted of thoracic or lumbar spine.   Neurologic:  No cranial nerve deficits noted. Station and gait are normal. Plantar reflexes are down-going bilaterally. DTRs are symmetrical throughout. Sensory, motor and coordinative functions appear intact. Skin:  Intact without suspicious lesions or rashes Psych:  Cognition and judgment appear intact. Alert and cooperative with normal attention span and concentration. No apparent delusions, illusions, hallucinations   Impression & Recommendations:  Problem # 1:  ABDOMINAL PAIN (ICD-789.00) occasional Assessment Unchanged Creon prn  Problem # 2:  GERD (ICD-530.81) Assessment: Comment Only  His updated medication list for this problem includes:    Omeprazole 40 Mg Cpdr (Omeprazole) .[name]... 1 by mouth qam for indigestion prn  Problem # 3:  PANCREATITIS, HX OF (ICD-V12.70) Assessment: Comment Only  Problem # 4:  ABDOMINAL PAIN (ICD-789.00) Assessment: Comment Only  Complete Medication List: 1)  Xanax 0.5 Mg Tabs (Alprazolam) .... 1/2 or 1 tab by mouth two times a day as needed anxiety 2)  Temazepam 30 Mg Caps (Temazepam) .[name].. 1 by mouth at bedtime as needed insomnia 3)  Vitamin D3 1000 Unit Tabs (Cholecalciferol) .[name].. 1 by mouth daily 4)  Hydrocodone-acetaminophen 5-500 Mg Tabs (Hydrocodone-acetaminophen) .[name].. 1-2 tablets by mouth q 4-6 hours as needed pain 5)  Zyrtec Allergy 10 Mg Tabs (Cetirizine hcl) .... Once daily 6)  Pancreaze 21308 Unit Cpep (Pancrelipase (lip-prot-amyl)) .[name].. 1-2  by mouth three times a day w/meals 7)  Omeprazole 40 Mg Cpdr (Omeprazole) .[name].. 1 by mouth qam for indigestion prn  Patient Instructions: 1)  Try Kefir 2)  Try Creon samples as needed  3)  Please schedule a follow-up  appointment in 6 months well w/labs v70.0. Prescriptions: OMEPRAZOLE 40 MG CPDR (OMEPRAZOLE) 1 by mouth qam for indigestion prn  #90 x 3   Entered and Authorized by:   Tresa Garter MD   Signed by:   Tresa Garter MD on 07/04/2009   Method used:   Print then Give to Patient   RxID:   [id number] HYDROCODONE-ACETAMINOPHEN 5-500 MG TABS (HYDROCODONE-ACETAMINOPHEN) 1-2 tablets by mouth q 4-6 hours as needed pain  #60 x 3   Entered and Authorized by:   Tresa Garter MD   Signed by:   Tresa Garter MD on 07/04/2009   Method used:   Print then Give to Patient   RxID:   [id number] TEMAZEPAM 30 MG CAPS (TEMAZEPAM) 1 by mouth at bedtime as needed insomnia  #30 x 6   Entered and Authorized by:   Tresa Garter MD   Signed by:   Tresa Garter MD on 07/04/2009   Method used:   Print then Give to Patient   RxID:   [id number] XANAX 0.5 MG  TABS (ALPRAZOLAM) 1/2 or 1 tab by mouth two times a day as needed anxiety  #60 x 3   Entered and Authorized by:   Tresa Garter MD   Signed by:   Tresa Garter MD on 07/04/2009   Method used:   Print then Give to Patient   RxID:   [id number] PANCREAZE 38756 UNIT CPEP (PANCRELIPASE (LIP-PROT-AMYL)) 1-2 by mouth three times a day w/meals  #120 x 12   Entered and Authorized by:   Tresa Garter MD   Signed by:   Tresa Garter MD on 07/04/2009   Method used:   Print then Give to Patient   RxID:   [id number]

## 2010-07-27 NOTE — Progress Notes (Signed)
Summary: Followup  Phone Note Call from Patient   Summary of Call: Pt. was in hospital for pancreatitis. Seen by Dr. Juanda Chance and Gunnar Fusi.Has f/u with Dr.Plotnikov on 01/11/10 with blood work ordered prior to visit.Discharge instructiions say f/u with Dr.Perry in 4 weeks..Asking if Dr.Perry wants him to come in or review Dr.Plotinikov's visit and the labs? Initial call taken by: Teryl Lucy RN,  December 28, 2009 9:57 AM  Follow-up for Phone Call        see how he is feeling. Let him know that I reviewed his hospital record and x-rays. I would be happy to see him for routine followup or wait, if he is doing better. Let me know. Follow-up by: Hilarie Fredrickson MD,  December 28, 2009 10:34 AM  Additional Follow-up for Phone Call Additional follow up Details #1::        Message left to call back  Teryl Lucy RN  December 28, 2009 11:46 AM Pt. is doing much better has only some mild discomfort over pancreas. Will keep appt. with PCP. and call us back if any problems. Additional Follow-up by: Teryl Lucy RN,  December 28, 2009 11:52 AM

## 2010-09-10 LAB — COMPREHENSIVE METABOLIC PANEL
AST: 53 U/L — ABNORMAL HIGH (ref 0–37)
Albumin: 4.5 g/dL (ref 3.5–5.2)
Alkaline Phosphatase: 80 U/L (ref 39–117)
BUN: 7 mg/dL (ref 6–23)
BUN: 10 mg/dL (ref 6–23)
Calcium: 8.5 mg/dL (ref 8.4–10.5)
Chloride: 102 mEq/L (ref 96–112)
Creatinine, Ser: 0.72 mg/dL (ref 0.4–1.5)
Creatinine, Ser: 0.79 mg/dL (ref 0.4–1.5)
GFR calc Af Amer: >60 mL/min (ref >60)
Glucose, Bld: 85 mg/dL (ref 70–99)
Potassium: 4.3 mEq/L (ref 3.5–5.1)
Potassium: 4 mEq/L (ref 3.5–5.1)
Total Bilirubin: 0.7 mg/dL (ref 0.3–1.2)
Total Protein: 6 g/dL (ref 6.0–8.3)
Total Protein: 7.5 g/dL (ref 6.0–8.3)

## 2010-09-10 LAB — TSH: TSH: 2.237 u[IU]/mL (ref 0.350–4.500)

## 2010-09-10 LAB — URINALYSIS, MICROSCOPIC ONLY
Glucose, UA: NEGATIVE mg/dL
Leukocytes, UA: NEGATIVE
Protein, ur: NEGATIVE mg/dL
Specific Gravity, Urine: 1.01 (ref 1.005–1.030)
Urine-Other: NONE SEEN

## 2010-09-10 LAB — LIPID PANEL
Cholesterol: 180 mg/dL (ref 0–200)
LDL Cholesterol: 121 mg/dL — ABNORMAL HIGH (ref 0–99)
Triglycerides: 104 mg/dL (ref <150)
VLDL: 21 mg/dL (ref 0–40)

## 2010-09-10 LAB — GLUCOSE, CAPILLARY
Glucose-Capillary: 111 mg/dL — ABNORMAL HIGH (ref 70–99)
Glucose-Capillary: 155 mg/dL — ABNORMAL HIGH (ref 70–99)
Glucose-Capillary: 91 mg/dL (ref 70–99)
Glucose-Capillary: 132 mg/dL — ABNORMAL HIGH (ref 70–99)
Glucose-Capillary: 90 mg/dL (ref 70–99)
Glucose-Capillary: 96 mg/dL (ref 70–99)

## 2010-09-10 LAB — CULTURE, BLOOD (ROUTINE X 2): Culture: NO GROWTH

## 2010-09-10 LAB — CBC
HCT: 36.1 % — ABNORMAL LOW (ref 39.0–52.0)
HCT: 42.8 % (ref 39.0–52.0)
MCH: 30.4 pg (ref 26.0–34.0)
MCHC: 34.7 g/dL (ref 30.0–36.0)
MCV: 87.7 fL (ref 78.0–100.0)
MCV: 88 fL (ref 78.0–100.0)
RBC: 4.86 MIL/uL (ref 4.22–5.81)
RDW: 14.2 % (ref 11.5–15.5)
WBC: 12.9 10*3/uL — ABNORMAL HIGH (ref 4.0–10.5)

## 2010-09-10 LAB — DIFFERENTIAL
Basophils Relative: 0 % (ref 0–1)
Eosinophils Relative: 1 % (ref 0–5)
Lymphocytes Relative: 12 % (ref 12–46)
Neutro Abs: 10.8 10*3/uL — ABNORMAL HIGH (ref 1.7–7.7)
Neutrophils Relative %: 83 % — ABNORMAL HIGH (ref 43–77)

## 2010-09-10 LAB — HEMOGLOBIN A1C
Hgb A1c MFr Bld: 5.8 % — ABNORMAL HIGH (ref <5.7)
Mean Plasma Glucose: 120 mg/dL — ABNORMAL HIGH (ref <117)

## 2010-09-10 LAB — URINE CULTURE: Colony Count: NO GROWTH

## 2010-09-10 LAB — LIPASE, BLOOD
Lipase: 42 U/L (ref 11–59)
Lipase: 287 U/L — ABNORMAL HIGH (ref 11–59)

## 2010-10-05 LAB — COMPREHENSIVE METABOLIC PANEL
BUN: 14 mg/dL (ref 6–23)
Calcium: 9.7 mg/dL (ref 8.4–10.5)
Glucose, Bld: 132 mg/dL — ABNORMAL HIGH (ref 70–99)
Total Protein: 7.5 g/dL (ref 6.0–8.3)

## 2010-10-05 LAB — CBC
Hemoglobin: 11.8 g/dL — ABNORMAL LOW (ref 13.0–17.0)
Hemoglobin: 14.1 g/dL (ref 13.0–17.0)
MCHC: 33.6 g/dL (ref 30.0–36.0)
MCV: 86.4 fL (ref 78.0–100.0)
RBC: 4.84 MIL/uL (ref 4.22–5.81)
RDW: 13.3 % (ref 11.5–15.5)

## 2010-10-05 LAB — BASIC METABOLIC PANEL
Calcium: 8.7 mg/dL (ref 8.4–10.5)
GFR calc Af Amer: >60 mL/min (ref >60)
GFR calc non Af Amer: >60 mL/min (ref >60)
Glucose, Bld: 115 mg/dL — ABNORMAL HIGH (ref 70–99)
Sodium: 139 mEq/L (ref 135–145)

## 2010-10-05 LAB — DIFFERENTIAL
Basophils Relative: 0 % (ref 0–1)
Eosinophils Relative: 0 % (ref 0–5)
Lymphocytes Relative: 15 % (ref 12–46)
Monocytes Relative: 6 % (ref 3–12)
Neutrophils Relative %: 79 % — ABNORMAL HIGH (ref 43–77)

## 2010-10-05 LAB — LIPASE, BLOOD: Lipase: 1580 U/L — ABNORMAL HIGH (ref 11–59)

## 2010-10-05 LAB — PLATELET COUNT: Platelets: 166 10*3/uL (ref 150–400)

## 2010-11-07 NOTE — Assessment & Plan Note (Signed)
[facility]                         GASTROENTEROLOGY OFFICE NOTE   NAME:White, Luke KNIPPEL                       MRN:          [id number]  DATE:07/18/2007                            DOB:          [birth year]    Luke White presents today for followup after being hospitalized at [facility] with acute pancreatitis.  He was hospitalized in August,  2007 for acute pancreatitis.  He subsequently underwent follow-up CT  scan in September, 2007 because of the presence of pseudocyst.  At that  time, both inflammation and cyst size had improved.  I did discuss with  the patient additional followup. Due to financial concerns, he wished to  be followed expectantly.  I see him outside the office on a regular  basis and for the most part, he has done well and has been asymptomatic.  His weight and appetite have been stable.  However, on July 05, 2007,  he was hospitalized with acute abdominal pain.  I spoke to the admitting  physician, Dr. Leretha Dykes, who reports elevated amylase, lipase, and white  blood cell count.  As well, a CT scan showed fullness in the head of the  pancreas with inflammation changes, consistent with pancreatitis.  There  was concern about underlying mass or neoplasm.  I reviewed the CD of the  CT with Dr. Fredia Sorrow of [facility].  Indeed, an inflammatory  mass at the head of the pancreas was apparrent.  I could not say with  certainty whether there was underlying neoplasm.This will require close  followup.   The patient actually recovered quite quickly and was back to work on  January 13th.  He was using some Vicodin for pain.  He did not require  antiemetics.  At this point, he has not had a pain pill in over a week  and states that his diet is regular.  He had been using modest amounts  of alcohol recently.  He has been asked to discontinue all alcohol.   The CT did show what appeared to be a little calcium in the pancreas.  He has been investigated for other possible causes for pancreas. His  triglycerides are normal.  Calcium is normal.  There has been no  evidence of gallstones.  His alcohol history seems modest, at most.  He  has not had an MRCP or EUS.   He does have one aunt on his father's side that had pancreatitis. No  further details.   His only medications are multivitamins and Claritin.   PHYSICAL EXAMINATION:  A well-appearing male in no acute distress.  Blood pressure is 128/86.  Heart rate is 92.  Weight is 166.8 pounds.  HEENT:  Sclerae are anicteric.  LUNGS:  Clear.  HEART:  Regular.  ABDOMEN:  Soft with mild tenderness in the epigastric region to deep  palpation.  Good bowel sounds heard.  No mass felt.  EXTREMITIES:  Without edema.   IMPRESSION:  Recurrent acute pancreatitis:  At this point, the etiology  is uncertain.  A question of underlying mass on recent CT versus changes  due to inflammation only.  RECOMMENDATIONS:  1. Repeat laboratories today, including CBC, comprehensive metabolic      panel, amylase, and lipase.  2. Low fat diet and alcohol avoidance.  3. Plan on repeat imaging study in about eight weeks.  Either repeat      CT or MRI with MRCP.  4. Contact me in the interim for any recurrent problems.     Wilhemina Bonito. Marina Goodell, MD  Electronically Signed    JNP/MedQ  DD: 07/18/2007  DT: 07/18/2007  Job #: [id number]   cc:   Barbette Hair. Artist Pais, DO

## 2010-11-07 NOTE — H&P (Signed)
Luke White, Luke White NO.:  [id number]   MEDICAL RECORD NO.:  [id number]          PATIENT TYPE:  INP   LOCATION:  0110                         [facility]ACILITY[facility]   PHYSICIAN:  Barbette Hair. Arlyce Dice, MD,FACGDATE OF BIRTH:  07/13/1960   DATE OF ADMISSION:  09/02/2008  DATE OF DISCHARGE:                              HISTORY & PHYSICAL   HISTORY OF PRESENT ILLNESS:  Luke White is a 50 year old white male known  to Luke White in our practice, for a history of pancreatitis,  complicated by pseudo-cysts.  The patient's first episode of  pancreatitis was in August 2007, secondary episode in January 2009.  A  CAT scan in January 2009, showed fullness in the head of the pancreas  with inflammatory changes.  There was a concern for underlying mass or  neoplasm.  A follow-up CAT scan done on September 08, 2007, showed acute on  chronic pancreatitis with enlarging uncinate pseudo-cysts.  In August  2009, the patient had an EUS with Dr. Rachael Fee.  Pseudo-cyst was  drained.  Pancreatic parenchymal and ductal findings were suspicious but  not diagnostic for chronic pancreatitis.  Fluid aspirate from the pseudo-  cyst was negative for malignant cells.  The patient had done well since  his EUS until two weeks ago when he developed epigastric pain radiating  through to the back.  The pain was worse with meals.  Has been  progressive over the last week.  Today the pain was 10/10 and associated  with an episode of nausea and vomiting.  The pain is better in the  sitting position.  Subjective fevers at home.  No diarrhea or other GI  complaints.   PAST MEDICAL HISTORY:  1. Hepatitis-C  antibody positive, negative viral load.  2. Gastroesophageal reflux disease.  3. Pancreatitis/pseudo-cysts.   FAMILY HISTORY:  An aunt with pancreatitis, possibly alcohol-related and  hypertension.   SOCIAL HISTORY:  The patient is a Interior and spatial designer.  No alcohol since his  first episode of  pancreatitis in 2007.   HOME MEDICATIONS:  1. BuSpar 10 mg q. morning.  2. Temazepam, unsure of how many mg he takes every night.  3. Zyrtec-D.  4. Multiple vitamins.  5. Vitamin D.  No NSAID's at home.   ALLERGIES:  No known drug allergies.   REVIEW OF SYSTEMS:  CONSTITUTIONAL:  Subjective fevers.  Poor appetite  as of late.  HEENT:  Negative.  CARDIAC:  Negative.  RESPIRATORY:  Negative.  GI:  See history of present illness.  GENITOURINARY:  Negative.  SKIN:  Negative.  MUSCULOSKELETAL: Negative.  PSYCHIATRIC:  Negative.  NEUROLOGIC:  Negative.  All other review of systems negative  other than noted in the HPI.   PHYSICAL EXAMINATION:  VITAL SIGNS:  Temperature 97.4 degrees, heart  rate 74, respirations 18, blood pressure 132/79, oxygen saturation 99%  on room air.  GENERAL:  Luke White is a pleasant 50 year old white male sitting up in a  chair, in no acute distress.  HEENT:  Head atraumatic.  Conjunctivae pink.  No icterus.  No oral  lesions.  NECK:  Supple, no masses.  No lymphadenopathy.  CARDIAC:  Regular rate and rhythm.  No murmurs heard.  RESPIRATORY:  Lungs clear to auscultation bilaterally.  ABDOMEN:  Soft, nondistended.  Decreased bowel sounds.  There is marked  tenderness in the epigastric area.  No masses felt.  EXTREMITIES:  No palmar erythema.  No edema.  SKIN:  Warm, dry and intact, without any significant lesions.  NEUROLOGIC:  Alert and oriented.  PSYCHOLOGIC:  Pleasant and cooperative.   LABORATORY DATA:  WBC 10.6, hemoglobin 14.1, hematocrit 41.8, platelets  not reported as they were clumped.  Sodium 139, potassium 4.5, glucose  132, BUN 14, creatinine 0.76.  Liver function tests normal.  Amylase  936, lipase 1580.   IMPRESSION:  1. A 50 year old with recurrent pancreatitis, likely acute on chronic      with pancreatic calcifications seen on prior CT.  Pancreatitis of      unclear etiology.  No history of alcohol use in the last 1-1/2 to 2      years.   No history of bowel sounds.  2. Abnormal platelet count (clumped).  No prior history of      thrombocytopenia.   PLAN:  Will admit to the service of Dr. Barbette Hair. Arlyce Dice.  The patient  will be started on IV fluids, analgesics and antiemetics.  He will  remain n.p.o. for now.  We may need a CT of the abdomen and pelvis to  evaluate for recurrent pseudo-cysts.  The patient will be given deep  venous thrombosis prophylaxis.  He will continue his home medications.      Luke Cluster, NP      Barbette Hair. Arlyce Dice, MD,FACG  Electronically Signed    PG/MEDQ  D:  09/02/2008  T:  09/02/2008  Job:  [id number]

## 2010-11-10 NOTE — H&P (Signed)
Luke White, [name]:  [id number]   MEDICAL RECORD NO.:  [id number]          PATIENT TYPE:  INP   LOCATION:  5731                         FACILITY:  [facility]   PHYSICIAN:  Barbette Hair. Artist Pais, DO      DATE OF BIRTH:  03-15-1961   DATE OF ADMISSION:  01/30/2006  DATE OF DISCHARGE:                                HISTORY & PHYSICAL   CHIEF COMPLAINT:  Abdominal pain.   HISTORY OF PRESENT ILLNESS:  The patient is a 50 year old white male with  past medical history of GERD and abnormal CAT scan of the pancreas who comes  in with three to four days of abdominal pain with associated nausea and  vomiting.  The patient states that the symptoms started on Monday with mild  epigastric pain.  The patient has some radiation to the back and he has had  increasing progressive worsening of nausea and vomiting.  He vomited five  times today and has been unable to even keep down liquids.  He has also been  unable to take his normal Nexium dose.  The patient has mild chills and also  diaphoresis.  He denies any unusual food intake.   No associated chest pain or shortness of breath.   PAST MEDICAL HISTORY:  Gastroesophageal reflux disease.  He has been  previously evaluated by Dr. Marina Goodell of [facility] for abnormal LFT's and  abnormal CT of the pancreas.   CURRENT MEDICATIONS:  1. Nexium 40 mg once a day.  2. Multivitamin one a day.   ALLERGIES:  None known.   SOCIAL HISTORY:  He is divorced.  He has two daughters.  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   FAMILY HISTORY:  Mother is 33 and has high cholesterol and hypertension.  Father is 62 with a history of alcoholism and hypertension.   HABITS:  He averages 15 drinks per week.  He has been a light social smoker  for the last 10 years.   PREVIOUS LABORATORY DATA:  He has had positive hepatitis C antibody,  hepatitis C viral load was negative.  He had an abdominal ultrasound back in  May 2007 which  showed normal abdominal ultrasound except for pancreas which  was enlarged and an anechoic area measuring 37 mm next to the head.  A CAT  scan of the abdomen was obtained on Nov 12, 2005 which revealed enlargement  of the head of the pancreas with an indeterminate cystic lesion noted  centrally within the head of the pancreas measuring 2.1 cm in diameter.   REVIEW OF SYMPTOMS:  As noted above.  All systems negative.   PHYSICAL EXAMINATION:  VITAL SIGNS:  Weight 160 pounds, temperature 97.5  degrees, pulse 76, blood pressure 150/100 in the left arm in a seated  position.  GENERAL:  The patient is a very pleasant 50 year old white male in some mild  distress.  HEENT:  Normocephalic, atraumatic.  Pupils are equal and reactive to light  bilaterally.  Extraocular motility intact.  Patient was anicteric.  Conjunctival within normal limits.  Auditory  canals and tympanic membranes  are clear bilaterally.  Oropharynx is unremarkable.  NECK:  Supple, no adenopathy.  CHEST:  Normal respiratory effort, clear to auscultation bilaterally.  No  rhonchi, rales or wheezing.  CARDIOVASCULAR:  Regular rate and rhythm.  No significant murmurs, rubs or  gallops appreciated.  He was noted to be tachycardic.  ABDOMEN:  Soft.  The patient had mid-epigastric tenderness.  No rebound.  Positive bowel sounds.  EXTREMITIES:  No clubbing, cyanosis or edema.  SKIN:  Warm and dry.   IMPRESSION:  1. Acute abdominal pain with tractable nausea and vomiting.  2. History of GERD.   RECOMMENDATIONS:  With the patient's past medical history of abnormal  pancreatic mass, symptoms suspicious for possible pancreatitis.  The patient  will be admitted for further evaluation and care.  He will obtain a stat  amylase, lipase, provide antiemetics and once creatinine is obtained and  normal a CAT scan of his abdomen and pelvis with be ordered with IV  contrast.      Barbette Hair. Artist Pais, DO  Electronically Signed      RDY/MEDQ  D:  01/30/2006  T:  01/30/2006  Job:  [id number]

## 2010-11-10 NOTE — Discharge Summary (Signed)
Luke White, WEITZEL NO.:  [id number]   MEDICAL RECORD NO.:  [id number]          PATIENT TYPE:  INP   LOCATION:  1522                         FACILITY:  [facility]   PHYSICIAN:  Barbette Hair. Arlyce Dice, MD,FACGDATE OF BIRTH:  [birth year]   DATE OF ADMISSION:  09/02/2008  DATE OF DISCHARGE:  09/04/2008                               DISCHARGE SUMMARY   DISPOSITION:  Home in stable condition.   DISCHARGE MEDICATIONS:  1. Hydrocodone 5/500 one every 6 hours as needed for pain.  2. Continue home BuSpar 10 mg daily.  3. Multiple vitamins 1 tablet daily.  4. Zyrtec 10 mg daily.  5. Xanax 0.5 mg 1/2 tablet to 1 tablet as needed.  6. Temazepam 30 mg at bedtime as needed.   CONSULTATIONS:  None requested.   PROCEDURES:  None performed this admission.   ADMITTING DIAGNOSES:  1. Acute on chronic pancreatitis.  No alcohol intake in the last 1-1/2      to 2 years.  2. History of hepatitis C antibody positive with negative viral load.  3. History of gastroesophageal reflux disease.   HOSPITAL COURSE:  Mr. Luke White 1 to 2 years ago no alcohol intake and was  seen in the emergency department by [place] on September 02, 2008, for severe  epigastric pain radiating through to the back.  The patient had a known  history of pancreatitis complicated by pseudocyst.  He has been followed  by Dr. Marina Goodell in our office.  In the emergency department, the patient's  CBC was 10.6.  He was afebrile.  The patient's current episode of  epigastric pain had begun a week prior to presenting to the emergency  department.  The pain was progressive, worse with meals.  In the  emergency department, the pain was reported by the patient as 10/10 on  the pain scale.  It was associated with nausea and vomiting.  Pain was  better in the sitting position.  ER labs showed an amylase of 935 and a  lipase of 1580.  The patient was admitted for IV fluids, analgesics,  antiemetics, and bowel rest.  CT scan was not ordered, but  was planned  in the event the patient did not have rapid improvement in symptoms.  On  the second day of admission, the patient reported having not slept well.  He was still requiring Dilaudid.  On the second day of admission, the  patient reported not having slept well through the night.  He was still  requiring Dilaudid, but later in the afternoon requested a clear liquid  diet, which he was granted.  The following day, the patient wanted to go  home, he was feeling better.  His abdominal exam was benign.  The  patient was given a low fat solid diet for lunch which he  tolerated.  He was discharged home and asked to call our office Monday  morning for a followup appointment with Dr. Marina Goodell.  On the day of  discharge, the patient was afebrile at 98.4.  Blood pressure 109/72.  He  was given Vicodin for any recurrent pain  he may have.  The patient was  to continue his home medications.  He left in stable condition.      Willette Cluster, NP      Barbette Hair. Arlyce Dice, MD,FACG  Electronically Signed    PG/MEDQ  D:  11/02/2008  T:  11/03/2008  Job:  [id number]

## 2010-11-10 NOTE — Assessment & Plan Note (Signed)
[facility]                                   ON-CALL NOTE   NAME:HYLERTrinidad, Petron                         MRN:          [id number]  DATE:02/28/2006                            DOB:          01-13-1961    I received a call at home yesterday evening from Mr. Lindroth regarding  abdominal pain.  He had recently been hospitalized with pancreatitis.  He  states that his pain was similar to problems with pancreatitis though not as  severe. However, he was fearful that he might require hospitalization.  He  did vomit once.  No fevers or other problems. He described his pain as 4 out  of 10.  He is able to take liquids without exacerbation of pain.  He does  have Phenergan at home for nausea.  I called him in Vicodin for pain.  I  have asked him to restrict his diet to liquids.  I called regarding followup  this morning.  He was feeling better.  He will continue with pain  medications and antiemetics as needed and advance his diet slowly as  tolerated. He knows to contact the office should his problem return or  worsen. He does have scheduled followup with me in the next week or two as  well as followup imaging study.                                   Wilhemina Bonito. Eda Keys., MD   JNP/MedQ  DD:  02/28/2006  DT:  02/28/2006  Job #:  [id number]   cc:   Barbette Hair. Artist Pais, DO

## 2010-11-10 NOTE — Discharge Summary (Signed)
NAMEPLUMER, MITTELSTAEDT NO.:  [id number]   MEDICAL RECORD NO.:  [id number]          PATIENT TYPE:  INP   LOCATION:  5731                         FACILITY:  [facility]   PHYSICIAN:  Thomos Lemons, D.O. [facility]   DATE OF BIRTH:  [birth year]   DATE OF ADMISSION:  01/30/2006  DATE OF DISCHARGE:  02/02/2006                                 DISCHARGE SUMMARY   ADMITTING DIAGNOSES:  1. Acute abdominal pain with intractable nausea and vomiting.  2. History of esophageal reflux.   DISCHARGE DIAGNOSES:  Pancreatitis, almost completely resolved.   BRIEF HISTORY:  Mr. Yankee is a 50 year old male who has a history of  esophageal reflux with a past history of pancreatic abnormalities on CT scan  suggesting pancreatitis.  He presents with three to four days of abdominal  pain with nausea and vomiting.  The pain did radiate to the back and was  associated with progressive worsening of his nausea and vomiting.  The day  of admission he vomited five times and had been unable to retain even  liquids.  He was also unable to take Nexium for his reflux.  The symptoms  were associated with chills and diaphoresis.   SOCIAL HISTORY:  Reveals that he smoked socially and averages 15 alcohol  beverages per week.   At the time of admission he was afebrile and negative clinically in mild  distress.  He had mid-epigastric tenderness without rebound.   At the time of admission amylase was 709 with normal less than 131.  Lipase  was 787 with normal less than 51.  Glucose was 137.  Alkaline phosphatase  was 134.  Otherwise the comprehensive metabolic profile was normal.  His  white count was elevated at 13,000.  Hematocrit was 46.6.  Urine revealed 40  mL/dL ketones and 30 mg/dL protein which would be compatible with the  clinical history of repeated nausea and vomiting.  It did reveal a few  bacteria.   CT of the abdomen and pelvis revealed extensive inflammatory changes  surrounding the pancreatic head  and duodenum.  Compared to the study Nov 12, 2005, there had been marked enlargement of the size of the edematous changes  of the pancreas.  This suggested ongoing pancreatitis with possible  pseudocyst formation.  CT of the pelvis was negative.  He was kept n.p.o.  and received parenteral medications and intravenous fluids.  Follow up  amylase was 172 and lipase 78.  White count decreased to 9,900 with no  change in hematocrit.  CA 19-9 was 53.1 with reference range less than 35.   On the morning of discharge he was essentially pain-free.  He had minimal  tenderness in the epigastrium.  Abdomen was soft with active bowel sounds.  He had been passing gas and had tolerated food &  liquid intake over the  previous 24 hours.  His diet was to be advanced as tolerated.  Dr. Christella Hartigan'  note February 01, 2006 indicated that his office would arrange for a CT scan  with a pancreatic protocol in six weeks, with follow up office  visit with  Dr. Christella Hartigan after the radiographic results were back.  Complete alcohol  abstinence was stressed.   DISCHARGE STATUS:  Improved.   PROGNOSIS:  Good based on his relatively young age with abstinence from  alcohol.   There would be no change in his home medications.  He will return to the  care of Dr. Thomos Lemons and Dr. Christella Hartigan.  This was dictated in the presence of  Mr. Gidney to facilitate continuity of care.      Titus Dubin. Alwyn Ren, MD,FACP,FCCP  Electronically Signed      ______________________________  Thomos Lemons, D.O. [facility]    [facility]/MEDQ  D:  02/02/2006  T:  02/02/2006  Job:  [id number]   cc:   Rachael Fee, MD

## 2011-01-03 ENCOUNTER — Encounter: Payer: Self-pay | Admitting: Internal Medicine

## 2011-01-05 ENCOUNTER — Encounter: Payer: Self-pay | Admitting: Internal Medicine

## 2011-01-08 ENCOUNTER — Other Ambulatory Visit: Payer: Self-pay

## 2011-01-08 ENCOUNTER — Other Ambulatory Visit: Payer: Self-pay | Admitting: Internal Medicine

## 2011-01-08 DIAGNOSIS — Z0000 Encounter for general adult medical examination without abnormal findings: Secondary | ICD-10-CM

## 2011-01-12 ENCOUNTER — Other Ambulatory Visit (INDEPENDENT_AMBULATORY_CARE_PROVIDER_SITE_OTHER): Payer: Self-pay

## 2011-01-12 ENCOUNTER — Other Ambulatory Visit: Payer: Self-pay | Admitting: Internal Medicine

## 2011-01-12 DIAGNOSIS — Z0000 Encounter for general adult medical examination without abnormal findings: Secondary | ICD-10-CM

## 2011-01-12 LAB — BASIC METABOLIC PANEL
BUN: 16 mg/dL (ref 6–23)
CO2: 27 mEq/L (ref 19–32)
Chloride: 107 mEq/L (ref 96–112)
Creatinine, Ser: 0.6 mg/dL (ref 0.4–1.5)
Glucose, Bld: 103 mg/dL — ABNORMAL HIGH (ref 70–99)

## 2011-01-12 LAB — URINALYSIS
Leukocytes, UA: NEGATIVE
Nitrite: NEGATIVE
Specific Gravity, Urine: >=1.03 (ref 1.000–1.030)
pH: 6 (ref 5.0–8.0)

## 2011-01-12 LAB — CBC WITH DIFFERENTIAL/PLATELET
Basophils Relative: 0.2 % (ref 0.0–3.0)
Hemoglobin: 14.3 g/dL (ref 13.0–17.0)
Lymphocytes Relative: 23.1 % (ref 12.0–46.0)
MCHC: 34.3 g/dL (ref 30.0–36.0)
Monocytes Relative: 5.7 % (ref 3.0–12.0)
Neutro Abs: 6.6 10*3/uL (ref 1.4–7.7)
RBC: 4.68 Mil/uL (ref 4.22–5.81)

## 2011-01-12 LAB — LIPID PANEL
Total CHOL/HDL Ratio: 5
Triglycerides: 274 mg/dL — ABNORMAL HIGH (ref 0.0–149.0)

## 2011-01-12 LAB — HEPATIC FUNCTION PANEL
Albumin: 4.8 g/dL (ref 3.5–5.2)
Alkaline Phosphatase: 99 U/L (ref 39–117)
Total Bilirubin: 0.6 mg/dL (ref 0.3–1.2)

## 2011-01-12 LAB — TSH: TSH: 0.99 u[IU]/mL (ref 0.35–5.50)

## 2011-01-15 ENCOUNTER — Ambulatory Visit (INDEPENDENT_AMBULATORY_CARE_PROVIDER_SITE_OTHER): Payer: BC Managed Care – PPO | Admitting: Internal Medicine

## 2011-01-15 ENCOUNTER — Encounter: Payer: Self-pay | Admitting: Internal Medicine

## 2011-01-15 DIAGNOSIS — F411 Generalized anxiety disorder: Secondary | ICD-10-CM

## 2011-01-15 DIAGNOSIS — R109 Unspecified abdominal pain: Secondary | ICD-10-CM

## 2011-01-15 DIAGNOSIS — Z8719 Personal history of other diseases of the digestive system: Secondary | ICD-10-CM

## 2011-01-15 MED ORDER — BUSPIRONE HCL 15 MG PO TABS: 15.0000 mg | ORAL_TABLET | Freq: Two times a day (BID) | ORAL | Status: DC

## 2011-01-15 MED ORDER — PANCRELIPASE (LIP-PROT-AMYL) 10000 UNITS PO CPEP: 1.0000 | ORAL_CAPSULE | Freq: Three times a day (TID) | ORAL | Status: DC

## 2011-01-15 MED ORDER — TEMAZEPAM 30 MG PO CAPS: 30.0000 mg | ORAL_CAPSULE | Freq: Every evening | ORAL | Status: DC | PRN

## 2011-01-15 MED ORDER — HYDROCODONE-ACETAMINOPHEN 5-500 MG PO TABS: 1.0000 | ORAL_TABLET | Freq: Four times a day (QID) | ORAL | Status: DC | PRN

## 2011-01-15 MED ORDER — OMEPRAZOLE 40 MG PO CPDR: 40.0000 mg | DELAYED_RELEASE_CAPSULE | ORAL | Status: DC

## 2011-01-15 MED ORDER — ALPRAZOLAM 0.5 MG PO TABS: 0.2500 mg | ORAL_TABLET | Freq: Two times a day (BID) | ORAL | Status: DC | PRN

## 2011-01-15 NOTE — Progress Notes (Signed)
  Subjective:    Patient ID: Luke White, male    DOB: 06-23-1961, 50 y.o.   MRN: [id number]  HPI  The patient presents for a follow-up of  chronic anxiety, pancreatitis, recurrent abd pain controlled with medicines     Review of Systems  Constitutional: Negative for appetite change, fatigue and unexpected weight change.  HENT: Negative for nosebleeds, congestion, sore throat, sneezing, trouble swallowing and neck pain.   Eyes: Negative for itching and visual disturbance.  Respiratory: Negative for cough.   Cardiovascular: Negative for chest pain, palpitations and leg swelling.  Gastrointestinal: Positive for abdominal pain. Negative for nausea, diarrhea, blood in stool and abdominal distention.  Genitourinary: Negative for frequency and hematuria.  Musculoskeletal: Negative for back pain, joint swelling and gait problem.  Skin: Negative for rash.  Neurological: Negative for dizziness, tremors, speech difficulty and weakness.  Psychiatric/Behavioral: Negative for suicidal ideas, sleep disturbance, dysphoric mood and agitation. The patient is nervous/anxious.    Wt Readings from Last 3 Encounters:  01/15/11 171 lb (77.565 kg)  07/17/10 173 lb (78.472 kg)  01/11/10 166 lb (75.297 kg)       Objective:   Physical Exam  Constitutional: He is oriented to person, place, and time. He appears well-developed.  HENT:  Mouth/Throat: Oropharynx is clear and moist.  Eyes: Conjunctivae are normal. Pupils are equal, round, and reactive to light.  Neck: Normal range of motion. No JVD present. No thyromegaly present.  Cardiovascular: Normal rate, regular rhythm, normal heart sounds and intact distal pulses.  Exam reveals no gallop and no friction rub.   No murmur heard. Pulmonary/Chest: Effort normal and breath sounds normal. No respiratory distress. He has no wheezes. He has no rales. He exhibits no tenderness.  Abdominal: Soft. Bowel sounds are normal. He exhibits no distension and no mass.  There is no tenderness. There is no rebound and no guarding.  Musculoskeletal: Normal range of motion. He exhibits no edema and no tenderness.  Lymphadenopathy:    He has no cervical adenopathy.  Neurological: He is alert and oriented to person, place, and time. He has normal reflexes. No cranial nerve deficit. He exhibits normal muscle tone. Coordination normal.  Skin: Skin is warm and dry. No rash noted.  Psychiatric: He has a normal mood and affect. His behavior is normal. Judgment and thought content normal.          Assessment & Plan:

## 2011-01-15 NOTE — Assessment & Plan Note (Signed)
Digestive enzymes Vicodin prn

## 2011-01-15 NOTE — Assessment & Plan Note (Signed)
Cont w/diet Zenpep

## 2011-01-15 NOTE — Assessment & Plan Note (Signed)
On Rx 

## 2011-01-16 ENCOUNTER — Telehealth: Payer: Self-pay | Admitting: *Deleted

## 2011-01-16 MED ORDER — BUPROPION HCL ER (XL) 150 MG PO TB24: 150.0000 mg | ORAL_TABLET | ORAL | Status: DC

## 2011-01-16 NOTE — Telephone Encounter (Signed)
Ok - see meds thx

## 2011-01-16 NOTE — Telephone Encounter (Signed)
Patient requesting RX for Wellbutrin as discussed at OV.

## 2011-01-17 NOTE — Telephone Encounter (Signed)
Left vm for pt to check w/his pharm

## 2011-01-29 ENCOUNTER — Other Ambulatory Visit: Payer: Self-pay | Admitting: Internal Medicine

## 2011-03-23 LAB — CEA (CARCINOEMBRYONIC ANTIGEN), FLUID: CEA Fluid: 57.4 not reported

## 2011-03-23 LAB — AMYLASE, BODY FLUID: Amylase, Fluid: 21678

## 2011-04-10 ENCOUNTER — Other Ambulatory Visit: Payer: Self-pay | Admitting: Internal Medicine

## 2011-06-12 ENCOUNTER — Other Ambulatory Visit: Payer: Self-pay | Admitting: Internal Medicine

## 2011-06-18 NOTE — Telephone Encounter (Signed)
Hydrocodone-APAP request [last refill 07.23.12 #[id number]] Temazepam request [last refill 08.06.12 #[id number]]

## 2011-07-16 ENCOUNTER — Ambulatory Visit: Payer: BC Managed Care – PPO | Admitting: Internal Medicine

## 2011-07-27 ENCOUNTER — Encounter: Payer: Self-pay | Admitting: Internal Medicine

## 2011-07-27 ENCOUNTER — Ambulatory Visit (INDEPENDENT_AMBULATORY_CARE_PROVIDER_SITE_OTHER): Payer: BC Managed Care – PPO | Admitting: Internal Medicine

## 2011-07-27 DIAGNOSIS — R109 Unspecified abdominal pain: Secondary | ICD-10-CM

## 2011-07-27 DIAGNOSIS — G4700 Insomnia, unspecified: Secondary | ICD-10-CM

## 2011-07-27 DIAGNOSIS — K219 Gastro-esophageal reflux disease without esophagitis: Secondary | ICD-10-CM

## 2011-07-27 DIAGNOSIS — F411 Generalized anxiety disorder: Secondary | ICD-10-CM

## 2011-07-27 MED ORDER — ALPRAZOLAM 0.5 MG PO TABS: 0.2500 mg | ORAL_TABLET | Freq: Two times a day (BID) | ORAL | Status: DC | PRN

## 2011-07-27 MED ORDER — BUSPIRONE HCL 15 MG PO TABS: 15.0000 mg | ORAL_TABLET | Freq: Two times a day (BID) | ORAL | Status: DC

## 2011-07-27 MED ORDER — HYDROCODONE-ACETAMINOPHEN 5-500 MG PO TABS: 1.0000 | ORAL_TABLET | Freq: Four times a day (QID) | ORAL | Status: DC | PRN

## 2011-07-27 MED ORDER — TEMAZEPAM 30 MG PO CAPS: 30.0000 mg | ORAL_CAPSULE | Freq: Every evening | ORAL | Status: DC | PRN

## 2011-07-27 NOTE — Assessment & Plan Note (Signed)
Continue with current prescription therapy as reflected on the Med list.  

## 2011-07-27 NOTE — Progress Notes (Signed)
  Subjective:    Patient ID: Luke White, male    DOB: Apr 09, 1961, 51 y.o.   MRN: [id number]  HPI   The patient is here to follow up on chronic pancreatitis,, anxiety, abd pain and chronic moderate fibromyalgia symptoms controlled with medicines, diet and exercise.   Review of Systems  Constitutional: Negative for appetite change, fatigue and unexpected weight change.  HENT: Negative for nosebleeds, congestion, sore throat, sneezing, trouble swallowing and neck pain.   Eyes: Negative for itching and visual disturbance.  Respiratory: Negative for cough.   Cardiovascular: Negative for chest pain, palpitations and leg swelling.  Gastrointestinal: Positive for abdominal pain (3/wk chronic). Negative for nausea, diarrhea, blood in stool and abdominal distention.  Genitourinary: Negative for frequency and hematuria.  Musculoskeletal: Negative for back pain, joint swelling and gait problem.  Skin: Negative for rash.  Neurological: Negative for dizziness, tremors, speech difficulty and weakness.  Psychiatric/Behavioral: Negative for suicidal ideas, sleep disturbance, dysphoric mood and agitation. The patient is nervous/anxious.        Objective:   Physical Exam  Constitutional: He is oriented to person, place, and time. He appears well-developed.  HENT:  Mouth/Throat: Oropharynx is clear and moist.  Eyes: Conjunctivae are normal. Pupils are equal, round, and reactive to light.  Neck: Normal range of motion. No JVD present. No thyromegaly present.  Cardiovascular: Normal rate, regular rhythm, normal heart sounds and intact distal pulses.  Exam reveals no gallop and no friction rub.   No murmur heard. Pulmonary/Chest: Effort normal and breath sounds normal. No respiratory distress. He has no wheezes. He has no rales. He exhibits no tenderness.  Abdominal: Soft. Bowel sounds are normal. He exhibits no distension and no mass. There is no tenderness. There is no rebound and no guarding.    Musculoskeletal: Normal range of motion. He exhibits no edema and no tenderness.  Lymphadenopathy:    He has no cervical adenopathy.  Neurological: He is alert and oriented to person, place, and time. He has normal reflexes. No cranial nerve deficit. He exhibits normal muscle tone. Coordination normal.  Skin: Skin is warm and dry. No rash noted.  Psychiatric: He has a normal mood and affect. His behavior is normal. Judgment and thought content normal.          Assessment & Plan:

## 2011-09-12 ENCOUNTER — Other Ambulatory Visit: Payer: Self-pay

## 2011-09-12 MED ORDER — OMEPRAZOLE 40 MG PO CPDR: 40.0000 mg | DELAYED_RELEASE_CAPSULE | Freq: Every day | ORAL | Status: DC

## 2011-09-12 MED ORDER — BUPROPION HCL ER (XL) 150 MG PO TB24: 150.0000 mg | ORAL_TABLET | ORAL | Status: DC

## 2012-01-07 ENCOUNTER — Telehealth: Payer: Self-pay | Admitting: Internal Medicine

## 2012-01-07 NOTE — Telephone Encounter (Signed)
Received 3 pages from [facility], sent to Dr. Posey Rea. 01/07/12/SD.

## 2012-03-19 ENCOUNTER — Encounter: Payer: Self-pay | Admitting: Internal Medicine

## 2012-03-19 ENCOUNTER — Ambulatory Visit (INDEPENDENT_AMBULATORY_CARE_PROVIDER_SITE_OTHER): Payer: BC Managed Care – PPO | Admitting: Internal Medicine

## 2012-03-19 VITALS — BP 102/68 | HR 76 | Temp 98.3°F | Resp 16 | Wt 158.0 lb

## 2012-03-19 DIAGNOSIS — R109 Unspecified abdominal pain: Secondary | ICD-10-CM

## 2012-03-19 DIAGNOSIS — G4700 Insomnia, unspecified: Secondary | ICD-10-CM

## 2012-03-19 DIAGNOSIS — J309 Allergic rhinitis, unspecified: Secondary | ICD-10-CM

## 2012-03-19 DIAGNOSIS — K219 Gastro-esophageal reflux disease without esophagitis: Secondary | ICD-10-CM

## 2012-03-19 DIAGNOSIS — F411 Generalized anxiety disorder: Secondary | ICD-10-CM

## 2012-03-19 DIAGNOSIS — Z23 Encounter for immunization: Secondary | ICD-10-CM

## 2012-03-19 MED ORDER — OMEPRAZOLE 40 MG PO CPDR: 40.0000 mg | DELAYED_RELEASE_CAPSULE | Freq: Every day | ORAL | Status: DC

## 2012-03-19 MED ORDER — ALPRAZOLAM 0.5 MG PO TABS: 0.2500 mg | ORAL_TABLET | Freq: Two times a day (BID) | ORAL | Status: DC | PRN

## 2012-03-19 MED ORDER — ESZOPICLONE 3 MG PO TABS: 3.0000 mg | ORAL_TABLET | Freq: Every evening | ORAL | Status: DC | PRN

## 2012-03-19 NOTE — Progress Notes (Signed)
Subjective:    Patient ID: Luke White, male    DOB: [birth year], 51 y.o.   MRN: [id number]  HPI   The patient is here to follow up on chronic pancreatitis,, anxiety, abd pain and chronic moderate fibromyalgia symptoms controlled with medicines, diet and exercise.  BP Readings from Last 3 Encounters:  03/19/12 102/68  07/27/11 120/80  01/15/11 128/88     Review of Systems  Constitutional: Negative for appetite change, fatigue and unexpected weight change.  HENT: Negative for nosebleeds, congestion, sore throat, sneezing, trouble swallowing and neck pain.   Eyes: Negative for itching and visual disturbance.  Respiratory: Negative for cough.   Cardiovascular: Negative for chest pain, palpitations and leg swelling.  Gastrointestinal: Positive for abdominal pain (3/wk chronic). Negative for nausea, diarrhea, blood in stool and abdominal distention.  Genitourinary: Negative for frequency and hematuria.  Musculoskeletal: Negative for back pain, joint swelling and gait problem.  Skin: Negative for rash.  Neurological: Negative for dizziness, tremors, speech difficulty and weakness.  Psychiatric/Behavioral: Negative for suicidal ideas, disturbed wake/sleep cycle, dysphoric mood and agitation. The patient is nervous/anxious.        Objective:   Physical Exam  Constitutional: He is oriented to person, place, and time. He appears well-developed.  HENT:  Mouth/Throat: Oropharynx is clear and moist.  Eyes: Conjunctivae normal are normal. Pupils are equal, round, and reactive to light.  Neck: Normal range of motion. No JVD present. No thyromegaly present.  Cardiovascular: Normal rate, regular rhythm, normal heart sounds and intact distal pulses.  Exam reveals no gallop and no friction rub.   No murmur heard. Pulmonary/Chest: Effort normal and breath sounds normal. No respiratory distress. He has no wheezes. He has no rales. He exhibits no tenderness.  Abdominal: Soft. Bowel sounds are  normal. He exhibits no distension and no mass. There is no tenderness. There is no rebound and no guarding.  Musculoskeletal: Normal range of motion. He exhibits no edema and no tenderness.  Lymphadenopathy:    He has no cervical adenopathy.  Neurological: He is alert and oriented to person, place, and time. He has normal reflexes. No cranial nerve deficit. He exhibits normal muscle tone. Coordination normal.  Skin: Skin is warm and dry. No rash noted.  Psychiatric: He has a normal mood and affect. His behavior is normal. Judgment and thought content normal.   Lab Results  Component Value Date   WBC 9.4 01/12/2011   HGB 14.3 01/12/2011   HCT 41.7 01/12/2011   PLT 191.0 01/12/2011   GLUCOSE 103* 01/12/2011   CHOL 225* 01/12/2011   TRIG 274.0* 01/12/2011   HDL 44.60 01/12/2011   LDLDIRECT 149.4 01/12/2011   LDLCALC 109* 01/05/2010   ALT 24 01/12/2011   AST 29 01/12/2011   NA 143 01/12/2011   K 4.1 01/12/2011   CL 107 01/12/2011   CREATININE 0.6 01/12/2011   BUN 16 01/12/2011   CO2 27 01/12/2011   TSH 0.99 01/12/2011   PSA 0.36 01/05/2010   HGBA1C  Value: 5.8 (NOTE)                                                                       According to the ADA Clinical Practice Recommendations for 2011, when HbA1c is  used as a screening test:   >=6.5%   Diagnostic of Diabetes Mellitus           (if abnormal result  is confirmed)  5.7-6.4%   Increased risk of developing Diabetes Mellitus  References:Diagnosis and Classification of Diabetes Mellitus,Diabetes Care,2011,34(Suppl 1):S62-S69 and Standards of Medical Care in         Diabetes - 2011,Diabetes Care,2011,34  (Suppl 1):S11-S61.* 12/22/2009          Assessment & Plan:

## 2012-03-19 NOTE — Assessment & Plan Note (Signed)
Will try Lunesta. 

## 2012-03-20 ENCOUNTER — Telehealth: Payer: Self-pay | Admitting: Internal Medicine

## 2012-03-20 NOTE — Telephone Encounter (Signed)
Ok to change back to Temazepam Thx

## 2012-03-20 NOTE — Telephone Encounter (Signed)
Caller: Sinclair/Patient; Phone: [phone]; Reason for Call: Patient calling regarding a prescription Dr.  Posey Rea wrote yesterday for Lunesta.  States his insurance does not cover Lunesta and wants to know if he can get the prescription for Temazepam.  Please call him back.  Thanks

## 2012-03-23 NOTE — Assessment & Plan Note (Signed)
No relapse Continue with current prescription therapy as reflected on the Med list.  

## 2012-03-23 NOTE — Assessment & Plan Note (Signed)
Continue with current prescription therapy as reflected on the Med list.  

## 2012-03-24 DIAGNOSIS — Z23 Encounter for immunization: Secondary | ICD-10-CM

## 2012-03-24 NOTE — Telephone Encounter (Signed)
Rec fax back from [place] [facility] [place]. PA for Luke White is approved 03/24/12-12/18/2014. Pt informed. He wants to stay with Lunesta at this time.

## 2012-04-04 ENCOUNTER — Other Ambulatory Visit: Payer: Self-pay | Admitting: Internal Medicine

## 2012-04-04 DIAGNOSIS — Z0389 Encounter for observation for other suspected diseases and conditions ruled out: Secondary | ICD-10-CM

## 2012-04-04 DIAGNOSIS — Z0000 Encounter for general adult medical examination without abnormal findings: Secondary | ICD-10-CM

## 2012-04-04 NOTE — Telephone Encounter (Signed)
Pt called requesting refill of Temazepam because Luke White is not covered by [employer]

## 2012-04-08 ENCOUNTER — Telehealth: Payer: Self-pay | Admitting: *Deleted

## 2012-04-08 ENCOUNTER — Other Ambulatory Visit: Payer: Self-pay | Admitting: *Deleted

## 2012-04-08 MED ORDER — TEMAZEPAM 30 MG PO CAPS: 30.0000 mg | ORAL_CAPSULE | Freq: Every evening | ORAL | Status: DC | PRN

## 2012-04-08 NOTE — Telephone Encounter (Signed)
Ok for refill? 

## 2012-04-08 NOTE — Telephone Encounter (Signed)
Rf req for Temazepam 30 mg 1 po qhs. # 90. Ok to Rf in PCP absence? Thanks

## 2012-09-12 ENCOUNTER — Other Ambulatory Visit (INDEPENDENT_AMBULATORY_CARE_PROVIDER_SITE_OTHER): Payer: BC Managed Care – PPO

## 2012-09-12 DIAGNOSIS — Z0000 Encounter for general adult medical examination without abnormal findings: Secondary | ICD-10-CM

## 2012-09-12 DIAGNOSIS — Z0389 Encounter for observation for other suspected diseases and conditions ruled out: Secondary | ICD-10-CM

## 2012-09-12 LAB — CBC WITH DIFFERENTIAL/PLATELET
Eosinophils Relative: 1.9 % (ref 0.0–5.0)
HCT: 41.1 % (ref 39.0–52.0)
Hemoglobin: 14.1 g/dL (ref 13.0–17.0)
Lymphs Abs: 2.2 10*3/uL (ref 0.7–4.0)
Monocytes Relative: 8.5 % (ref 3.0–12.0)
Neutro Abs: 3 10*3/uL (ref 1.4–7.7)
WBC: 5.9 10*3/uL (ref 4.5–10.5)

## 2012-09-12 LAB — BASIC METABOLIC PANEL
GFR: 114.65 mL/min (ref >60.00)
Potassium: 5 mEq/L (ref 3.5–5.1)
Sodium: 138 mEq/L (ref 135–145)

## 2012-09-12 LAB — HEPATIC FUNCTION PANEL
ALT: 38 U/L (ref 0–53)
Albumin: 4.4 g/dL (ref 3.5–5.2)
Total Bilirubin: 0.7 mg/dL (ref 0.3–1.2)
Total Protein: 7.1 g/dL (ref 6.0–8.3)

## 2012-09-12 LAB — URINALYSIS, ROUTINE W REFLEX MICROSCOPIC
Hgb urine dipstick: NEGATIVE
Total Protein, Urine: NEGATIVE
Urine Glucose: NEGATIVE

## 2012-09-12 LAB — TSH: TSH: 2.24 u[IU]/mL (ref 0.35–5.50)

## 2012-09-12 LAB — PSA: PSA: 0.3 ng/mL (ref 0.10–4.00)

## 2012-09-12 LAB — LIPID PANEL
Cholesterol: 168 mg/dL (ref 0–200)
HDL: 35.2 mg/dL — ABNORMAL LOW (ref >39.00)
VLDL: 19.2 mg/dL (ref 0.0–40.0)

## 2012-09-15 ENCOUNTER — Encounter: Payer: BC Managed Care – PPO | Admitting: Internal Medicine

## 2012-09-15 ENCOUNTER — Ambulatory Visit (INDEPENDENT_AMBULATORY_CARE_PROVIDER_SITE_OTHER): Payer: BC Managed Care – PPO | Admitting: Internal Medicine

## 2012-09-15 ENCOUNTER — Encounter: Payer: Self-pay | Admitting: Internal Medicine

## 2012-09-15 VITALS — BP 130/90 | HR 80 | Temp 97.8°F | Resp 16 | Ht 71.0 in | Wt 172.0 lb

## 2012-09-15 DIAGNOSIS — Z1211 Encounter for screening for malignant neoplasm of colon: Secondary | ICD-10-CM

## 2012-09-15 DIAGNOSIS — Z0000 Encounter for general adult medical examination without abnormal findings: Secondary | ICD-10-CM

## 2012-09-15 MED ORDER — OMEPRAZOLE 40 MG PO CPDR: 40.0000 mg | DELAYED_RELEASE_CAPSULE | Freq: Every day | ORAL | Status: DC

## 2012-09-15 MED ORDER — BUSPIRONE HCL 15 MG PO TABS: 15.0000 mg | ORAL_TABLET | Freq: Two times a day (BID) | ORAL | Status: DC

## 2012-09-15 MED ORDER — ALPRAZOLAM 0.5 MG PO TABS: 0.2500 mg | ORAL_TABLET | Freq: Two times a day (BID) | ORAL | Status: DC | PRN

## 2012-09-15 MED ORDER — HYDROCODONE-ACETAMINOPHEN 5-325 MG PO TABS: 1.0000 | ORAL_TABLET | Freq: Two times a day (BID) | ORAL | Status: DC | PRN

## 2012-09-15 MED ORDER — TEMAZEPAM 30 MG PO CAPS: 30.0000 mg | ORAL_CAPSULE | Freq: Every evening | ORAL | Status: DC | PRN

## 2012-09-15 NOTE — Progress Notes (Signed)
Subjective:    Patient ID: Luke White, male    DOB: April 07, 1961, 52 y.o.   MRN: [id number]  HPI  The patient is here for a wellness exam. The patient has been doing well overall without major physical or psychological issues going on lately.  The patient is here to follow up on chronic pancreatitis, anxiety, abd pain and chronic moderate fibromyalgia symptoms controlled with medicines, diet and exercise. His partner is sick -- he is preparing for liver transplant...  BP Readings from Last 3 Encounters:  09/15/12 130/90  03/19/12 102/68  07/27/11 120/80   Wt Readings from Last 3 Encounters:  09/15/12 172 lb (78.019 kg)  03/19/12 158 lb (71.668 kg)  07/27/11 171 lb (77.565 kg)       Review of Systems  Constitutional: Negative for appetite change, fatigue and unexpected weight change.  HENT: Negative for nosebleeds, congestion, sore throat, sneezing, trouble swallowing and neck pain.   Eyes: Negative for itching and visual disturbance.  Respiratory: Negative for cough.   Cardiovascular: Negative for chest pain, palpitations and leg swelling.  Gastrointestinal: Positive for abdominal pain (3/wk chronic). Negative for nausea, diarrhea, blood in stool and abdominal distention.  Genitourinary: Negative for frequency and hematuria.  Musculoskeletal: Negative for back pain, joint swelling and gait problem.  Skin: Negative for rash.  Neurological: Negative for dizziness, tremors, speech difficulty and weakness.  Psychiatric/Behavioral: Negative for suicidal ideas, sleep disturbance, self-injury, dysphoric mood and agitation. The patient is nervous/anxious.        Objective:   Physical Exam  Constitutional: He is oriented to person, place, and time. He appears well-developed.  HENT:  Mouth/Throat: Oropharynx is clear and moist.  Eyes: Conjunctivae are normal. Pupils are equal, round, and reactive to light.  Neck: Normal range of motion. No JVD present. No thyromegaly present.   Cardiovascular: Normal rate, regular rhythm, normal heart sounds and intact distal pulses.  Exam reveals no gallop and no friction rub.   No murmur heard. Pulmonary/Chest: Effort normal and breath sounds normal. No respiratory distress. He has no wheezes. He has no rales. He exhibits no tenderness.  Abdominal: Soft. Bowel sounds are normal. He exhibits no distension and no mass. There is no tenderness. There is no rebound and no guarding.  Genitourinary:  Rectal per GI  Musculoskeletal: Normal range of motion. He exhibits no edema and no tenderness.  Lymphadenopathy:    He has no cervical adenopathy.  Neurological: He is alert and oriented to person, place, and time. He has normal reflexes. No cranial nerve deficit. He exhibits normal muscle tone. Coordination normal.  Skin: Skin is warm and dry. No rash noted.  Psychiatric: He has a normal mood and affect. His behavior is normal. Judgment and thought content normal.  sad   Lab Results  Component Value Date   WBC 5.9 09/12/2012   HGB 14.1 09/12/2012   HCT 41.1 09/12/2012   PLT 208.0 09/12/2012   GLUCOSE 130* 09/12/2012   CHOL 168 09/12/2012   TRIG 96.0 09/12/2012   HDL 35.20* 09/12/2012   LDLDIRECT 149.4 01/12/2011   LDLCALC 114* 09/12/2012   ALT 38 09/12/2012   AST 38* 09/12/2012   NA 138 09/12/2012   K 5.0 09/12/2012   CL 103 09/12/2012   CREATININE 0.8 09/12/2012   BUN 18 09/12/2012   CO2 26 09/12/2012   TSH 2.24 09/12/2012   PSA 0.30 09/12/2012   HGBA1C  Value: 5.8 (NOTE)  According to the ADA Clinical Practice Recommendations for 2011, when HbA1c is used as a screening test:   >=6.5%   Diagnostic of Diabetes Mellitus           (if abnormal result  is confirmed)  5.7-6.4%   Increased risk of developing Diabetes Mellitus  References:Diagnosis and Classification of Diabetes Mellitus,Diabetes Care,2011,34(Suppl 1):S62-S69 and Standards of Medical Care in         Diabetes -  2011,Diabetes Care,2011,34  (Suppl 1):S11-S61.* 12/22/2009          Assessment & Plan:

## 2012-09-18 DIAGNOSIS — Z0000 Encounter for general adult medical examination without abnormal findings: Secondary | ICD-10-CM | POA: Insufficient documentation

## 2012-09-18 NOTE — Assessment & Plan Note (Signed)
We discussed age appropriate health related issues, including available/recomended screening tests and vaccinations. We discussed a need for adhering to healthy diet and exercise. Labs/EKG were reviewed/ordered. All questions were answered.   

## 2012-09-28 ENCOUNTER — Other Ambulatory Visit: Payer: Self-pay | Admitting: Internal Medicine

## 2013-03-23 ENCOUNTER — Other Ambulatory Visit (INDEPENDENT_AMBULATORY_CARE_PROVIDER_SITE_OTHER): Payer: BC Managed Care – PPO

## 2013-03-23 ENCOUNTER — Encounter: Payer: Self-pay | Admitting: Internal Medicine

## 2013-03-23 ENCOUNTER — Ambulatory Visit (INDEPENDENT_AMBULATORY_CARE_PROVIDER_SITE_OTHER): Payer: BC Managed Care – PPO | Admitting: Internal Medicine

## 2013-03-23 VITALS — BP 136/96 | HR 66 | Temp 98.7°F | Wt 182.6 lb

## 2013-03-23 DIAGNOSIS — F329 Major depressive disorder, single episode, unspecified: Secondary | ICD-10-CM

## 2013-03-23 DIAGNOSIS — F411 Generalized anxiety disorder: Secondary | ICD-10-CM

## 2013-03-23 DIAGNOSIS — Z23 Encounter for immunization: Secondary | ICD-10-CM

## 2013-03-23 DIAGNOSIS — G4700 Insomnia, unspecified: Secondary | ICD-10-CM

## 2013-03-23 LAB — BASIC METABOLIC PANEL
BUN: 13 mg/dL (ref 6–23)
Calcium: 9.4 mg/dL (ref 8.4–10.5)
Chloride: 107 mEq/L (ref 96–112)
Potassium: 4.3 mEq/L (ref 3.5–5.1)

## 2013-03-23 LAB — HEPATIC FUNCTION PANEL
ALT: 42 U/L (ref 0–53)
AST: 34 U/L (ref 0–37)
Alkaline Phosphatase: 84 U/L (ref 39–117)
Bilirubin, Direct: 0.1 mg/dL (ref 0.0–0.3)
Total Bilirubin: 0.4 mg/dL (ref 0.3–1.2)

## 2013-03-23 MED ORDER — TEMAZEPAM 30 MG PO CAPS: 30.0000 mg | ORAL_CAPSULE | Freq: Every evening | ORAL | Status: DC | PRN

## 2013-03-23 MED ORDER — ALPRAZOLAM 0.5 MG PO TABS: 0.2500 mg | ORAL_TABLET | Freq: Two times a day (BID) | ORAL | Status: DC | PRN

## 2013-03-23 MED ORDER — HYDROCODONE-ACETAMINOPHEN 5-325 MG PO TABS: 1.0000 | ORAL_TABLET | Freq: Two times a day (BID) | ORAL | Status: DC | PRN

## 2013-03-23 MED ORDER — OMEPRAZOLE 40 MG PO CPDR: 40.0000 mg | DELAYED_RELEASE_CAPSULE | Freq: Every day | ORAL | Status: DC

## 2013-03-23 MED ORDER — BUSPIRONE HCL 15 MG PO TABS: 15.0000 mg | ORAL_TABLET | Freq: Two times a day (BID) | ORAL | Status: DC

## 2013-03-23 MED ORDER — BUPROPION HCL ER (XL) 150 MG PO TB24: ORAL_TABLET | ORAL | Status: DC

## 2013-03-23 NOTE — Assessment & Plan Note (Signed)
Continue with current prescription therapy as reflected on the Med list.  

## 2013-03-23 NOTE — Progress Notes (Signed)
Subjective:    HPI    The patient is here to follow up on chronic pancreatitis, anxiety, abd pain and chronic moderate fibromyalgia symptoms controlled with medicines, diet and exercise. His partner Casimiro Needle is sick -- he is preparing for liver transplant...  BP Readings from Last 3 Encounters:  03/23/13 136/96  09/15/12 130/90  03/19/12 102/68   Wt Readings from Last 3 Encounters:  03/23/13 182 lb 9.6 oz (82.827 kg)  09/15/12 172 lb (78.019 kg)  03/19/12 158 lb (71.668 kg)       Review of Systems  Constitutional: Negative for appetite change, fatigue and unexpected weight change.  HENT: Negative for nosebleeds, congestion, sore throat, sneezing, trouble swallowing and neck pain.   Eyes: Negative for itching and visual disturbance.  Respiratory: Negative for cough.   Cardiovascular: Negative for chest pain, palpitations and leg swelling.  Gastrointestinal: Positive for abdominal pain (3/wk chronic). Negative for nausea, diarrhea, blood in stool and abdominal distention.  Genitourinary: Negative for frequency and hematuria.  Musculoskeletal: Negative for back pain, joint swelling and gait problem.  Skin: Negative for rash.  Neurological: Negative for dizziness, tremors, speech difficulty and weakness.  Psychiatric/Behavioral: Negative for suicidal ideas, sleep disturbance, self-injury, dysphoric mood and agitation. The patient is nervous/anxious.        Objective:   Physical Exam  Constitutional: He is oriented to person, place, and time. He appears well-developed.  HENT:  Mouth/Throat: Oropharynx is clear and moist.  Eyes: Conjunctivae are normal. Pupils are equal, round, and reactive to light.  Neck: Normal range of motion. No JVD present. No thyromegaly present.  Cardiovascular: Normal rate, regular rhythm, normal heart sounds and intact distal pulses.  Exam reveals no gallop and no friction rub.   No murmur heard. Pulmonary/Chest: Effort normal and breath sounds  normal. No respiratory distress. He has no wheezes. He has no rales. He exhibits no tenderness.  Abdominal: Soft. Bowel sounds are normal. He exhibits no distension and no mass. There is no tenderness. There is no rebound and no guarding.  Genitourinary:  Rectal per GI  Musculoskeletal: Normal range of motion. He exhibits no edema and no tenderness.  Lymphadenopathy:    He has no cervical adenopathy.  Neurological: He is alert and oriented to person, place, and time. He has normal reflexes. No cranial nerve deficit. He exhibits normal muscle tone. Coordination normal.  Skin: Skin is warm and dry. No rash noted.  Psychiatric: He has a normal mood and affect. His behavior is normal. Judgment and thought content normal.  sad   Lab Results  Component Value Date   WBC 5.9 09/12/2012   HGB 14.1 09/12/2012   HCT 41.1 09/12/2012   PLT 208.0 09/12/2012   GLUCOSE 130* 09/12/2012   CHOL 168 09/12/2012   TRIG 96.0 09/12/2012   HDL 35.20* 09/12/2012   LDLDIRECT 149.4 01/12/2011   LDLCALC 114* 09/12/2012   ALT 38 09/12/2012   AST 38* 09/12/2012   NA 138 09/12/2012   K 5.0 09/12/2012   CL 103 09/12/2012   CREATININE 0.8 09/12/2012   BUN 18 09/12/2012   CO2 26 09/12/2012   TSH 2.24 09/12/2012   PSA 0.30 09/12/2012   HGBA1C  Value: 5.8 (NOTE)  According to the ADA Clinical Practice Recommendations for 2011, when HbA1c is used as a screening test:   >=6.5%   Diagnostic of Diabetes Mellitus           (if abnormal result  is confirmed)  5.7-6.4%   Increased risk of developing Diabetes Mellitus  References:Diagnosis and Classification of Diabetes Mellitus,Diabetes JQZE,0923,30(QTMAU 1):S62-S69 and Standards of Medical Care in         Diabetes - 2011,Diabetes QJFH,5456,25  (Suppl 1):S11-S61.* 12/22/2009          Assessment & Plan:

## 2013-03-24 ENCOUNTER — Telehealth: Payer: Self-pay | Admitting: Internal Medicine

## 2013-03-24 MED ORDER — ZOSTER VACCINE LIVE 19400 UNT/0.65ML ~~LOC~~ SOLR: 0.6500 mL | Freq: Once | SUBCUTANEOUS | Status: DC

## 2013-03-24 NOTE — Telephone Encounter (Signed)
done

## 2013-03-24 NOTE — Telephone Encounter (Signed)
Ok. Emailed to his pharmacy Thx

## 2013-03-24 NOTE — Telephone Encounter (Signed)
The patient called hoping to get an rx for a shingles shot.  He is wanting to get his shot done at his local pharmacy.   Thanks!

## 2013-09-21 ENCOUNTER — Ambulatory Visit (INDEPENDENT_AMBULATORY_CARE_PROVIDER_SITE_OTHER): Payer: BC Managed Care – PPO | Admitting: Internal Medicine

## 2013-09-21 ENCOUNTER — Encounter: Payer: Self-pay | Admitting: Internal Medicine

## 2013-09-21 VITALS — BP 140/88 | HR 76 | Temp 97.9°F | Resp 16 | Wt 185.0 lb

## 2013-09-21 DIAGNOSIS — Z8719 Personal history of other diseases of the digestive system: Secondary | ICD-10-CM

## 2013-09-21 DIAGNOSIS — F411 Generalized anxiety disorder: Secondary | ICD-10-CM

## 2013-09-21 DIAGNOSIS — G4700 Insomnia, unspecified: Secondary | ICD-10-CM

## 2013-09-21 DIAGNOSIS — F3289 Other specified depressive episodes: Secondary | ICD-10-CM

## 2013-09-21 DIAGNOSIS — R109 Unspecified abdominal pain: Secondary | ICD-10-CM

## 2013-09-21 DIAGNOSIS — F329 Major depressive disorder, single episode, unspecified: Secondary | ICD-10-CM

## 2013-09-21 MED ORDER — OMEPRAZOLE 40 MG PO CPDR: 40.0000 mg | DELAYED_RELEASE_CAPSULE | Freq: Every day | ORAL | Status: DC

## 2013-09-21 MED ORDER — ALPRAZOLAM 0.5 MG PO TABS: 0.2500 mg | ORAL_TABLET | Freq: Two times a day (BID) | ORAL | Status: DC | PRN

## 2013-09-21 MED ORDER — BUPROPION HCL ER (XL) 150 MG PO TB24: ORAL_TABLET | ORAL | Status: DC

## 2013-09-21 MED ORDER — BUSPIRONE HCL 15 MG PO TABS: 15.0000 mg | ORAL_TABLET | Freq: Two times a day (BID) | ORAL | Status: DC

## 2013-09-21 MED ORDER — HYDROCODONE-ACETAMINOPHEN 5-325 MG PO TABS
1.0000 | ORAL_TABLET | Freq: Two times a day (BID) | ORAL | Status: DC | PRN
Start: 2013-09-21 — End: 2014-04-09

## 2013-09-21 MED ORDER — TEMAZEPAM 30 MG PO CAPS: 30.0000 mg | ORAL_CAPSULE | Freq: Every evening | ORAL | Status: DC | PRN

## 2013-09-21 MED ORDER — HYDROCODONE-ACETAMINOPHEN 5-325 MG PO TABS: 1.0000 | ORAL_TABLET | Freq: Two times a day (BID) | ORAL | Status: DC | PRN

## 2013-09-21 MED ORDER — PANCRELIPASE (LIP-PROT-AMYL) 10000 UNITS PO CPEP: 1.0000 | ORAL_CAPSULE | Freq: Three times a day (TID) | ORAL | Status: DC

## 2013-09-21 NOTE — Assessment & Plan Note (Signed)
Continue with current prescription therapy as reflected on the Med list.  

## 2013-09-21 NOTE — Progress Notes (Signed)
Pre visit review using our clinic review tool, if applicable. No additional management support is needed unless otherwise documented below in the visit note. 

## 2013-09-21 NOTE — Assessment & Plan Note (Signed)
Occasional painx Continue with current prescription therapy as reflected on the Med list.

## 2013-09-21 NOTE — Progress Notes (Signed)
Subjective:    HPI  Luke White had a liver transplant in Jan 2015 [place] [facility]  The patient is here to follow up on chronic pancreatitis, anxiety, abd pain and chronic moderate fibromyalgia symptoms controlled with medicines, diet and exercise.  BP Readings from Last 3 Encounters:  09/21/13 140/88  03/23/13 136/96  09/15/12 130/90   Wt Readings from Last 3 Encounters:  09/21/13 185 lb (83.915 kg)  03/23/13 182 lb 9.6 oz (82.827 kg)  09/15/12 172 lb (78.019 kg)       Review of Systems  Constitutional: Negative for appetite change, fatigue and unexpected weight change.  HENT: Negative for congestion, nosebleeds, sneezing, sore throat and trouble swallowing.   Eyes: Negative for itching and visual disturbance.  Respiratory: Negative for cough.   Cardiovascular: Negative for chest pain, palpitations and leg swelling.  Gastrointestinal: Positive for abdominal pain (3/wk chronic). Negative for nausea, diarrhea, blood in stool and abdominal distention.  Genitourinary: Negative for frequency and hematuria.  Musculoskeletal: Negative for back pain, gait problem, joint swelling and neck pain.  Skin: Negative for rash.  Neurological: Negative for dizziness, tremors, speech difficulty and weakness.  Psychiatric/Behavioral: Negative for suicidal ideas, sleep disturbance, self-injury, dysphoric mood and agitation. The patient is nervous/anxious.        Objective:   Physical Exam  Constitutional: He is oriented to person, place, and time. He appears well-developed.  HENT:  Mouth/Throat: Oropharynx is clear and moist.  Eyes: Conjunctivae are normal. Pupils are equal, round, and reactive to light.  Neck: Normal range of motion. No JVD present. No thyromegaly present.  Cardiovascular: Normal rate, regular rhythm, normal heart sounds and intact distal pulses.  Exam reveals no gallop and no friction rub.   No murmur heard. Pulmonary/Chest: Effort normal and breath sounds  normal. No respiratory distress. He has no wheezes. He has no rales. He exhibits no tenderness.  Abdominal: Soft. Bowel sounds are normal. He exhibits no distension and no mass. There is no tenderness. There is no rebound and no guarding.  Genitourinary:  Rectal per GI  Musculoskeletal: Normal range of motion. He exhibits no edema and no tenderness.  Lymphadenopathy:    He has no cervical adenopathy.  Neurological: He is alert and oriented to person, place, and time. He has normal reflexes. No cranial nerve deficit. He exhibits normal muscle tone. Coordination normal.  Skin: Skin is warm and dry. No rash noted.  Psychiatric: He has a normal mood and affect. His behavior is normal. Judgment and thought content normal.  sad   Lab Results  Component Value Date   WBC 5.9 09/12/2012   HGB 14.1 09/12/2012   HCT 41.1 09/12/2012   PLT 208.0 09/12/2012   GLUCOSE 111* 03/23/2013   CHOL 168 09/12/2012   TRIG 96.0 09/12/2012   HDL 35.20* 09/12/2012   LDLDIRECT 149.4 01/12/2011   LDLCALC 114* 09/12/2012   ALT 42 03/23/2013   AST 34 03/23/2013   NA 138 03/23/2013   K 4.3 03/23/2013   CL 107 03/23/2013   CREATININE 0.7 03/23/2013   BUN 13 03/23/2013   CO2 28 03/23/2013   TSH 2.24 09/12/2012   PSA 0.30 09/12/2012   HGBA1C  Value: 5.8 (NOTE)  According to the ADA Clinical Practice Recommendations for 2011, when HbA1c is used as a screening test:   >=6.5%   Diagnostic of Diabetes Mellitus           (if abnormal result  is confirmed)  5.7-6.4%   Increased risk of developing Diabetes Mellitus  References:Diagnosis and Classification of Diabetes Mellitus,Diabetes JQZE,0923,30(QTMAU 1):S62-S69 and Standards of Medical Care in         Diabetes - 2011,Diabetes QJFH,5456,25  (Suppl 1):S11-S61.* 12/22/2009          Assessment & Plan:

## 2013-09-21 NOTE — Assessment & Plan Note (Signed)
Continue with current prescription therapy as reflected on the Med list.   Luke White had a liver transplant in Jan 2015 [place] [facility]

## 2013-09-21 NOTE — Assessment & Plan Note (Signed)
Discussed.

## 2014-03-12 ENCOUNTER — Other Ambulatory Visit: Payer: BC Managed Care – PPO

## 2014-03-15 ENCOUNTER — Ambulatory Visit (INDEPENDENT_AMBULATORY_CARE_PROVIDER_SITE_OTHER): Payer: BC Managed Care – PPO | Admitting: Internal Medicine

## 2014-03-15 ENCOUNTER — Other Ambulatory Visit (INDEPENDENT_AMBULATORY_CARE_PROVIDER_SITE_OTHER): Payer: BC Managed Care – PPO

## 2014-03-15 ENCOUNTER — Encounter: Payer: Self-pay | Admitting: Internal Medicine

## 2014-03-15 VITALS — BP 170/110 | HR 86 | Temp 98.4°F | Ht 71.0 in | Wt 176.0 lb

## 2014-03-15 DIAGNOSIS — Z0000 Encounter for general adult medical examination without abnormal findings: Secondary | ICD-10-CM

## 2014-03-15 DIAGNOSIS — F4321 Adjustment disorder with depressed mood: Secondary | ICD-10-CM

## 2014-03-15 DIAGNOSIS — F411 Generalized anxiety disorder: Secondary | ICD-10-CM

## 2014-03-15 DIAGNOSIS — Z23 Encounter for immunization: Secondary | ICD-10-CM

## 2014-03-15 LAB — CBC WITH DIFFERENTIAL/PLATELET
Basophils Absolute: 0 10*3/uL (ref 0.0–0.1)
Basophils Relative: 0.2 % (ref 0.0–3.0)
EOS PCT: 2.2 % (ref 0.0–5.0)
Eosinophils Absolute: 0.1 10*3/uL (ref 0.0–0.7)
HCT: 43.1 % (ref 39.0–52.0)
HEMOGLOBIN: 14.7 g/dL (ref 13.0–17.0)
Lymphocytes Relative: 22.8 % (ref 12.0–46.0)
Lymphs Abs: 1.5 10*3/uL (ref 0.7–4.0)
MCHC: 34 g/dL (ref 30.0–36.0)
MCV: 92.5 fl (ref 78.0–100.0)
MONO ABS: 0.6 10*3/uL (ref 0.1–1.0)
Monocytes Relative: 9.4 % (ref 3.0–12.0)
Neutro Abs: 4.2 10*3/uL (ref 1.4–7.7)
Neutrophils Relative %: 65.4 % (ref 43.0–77.0)
PLATELETS: 183.7 10*3/uL (ref 150.0–400.0)
RBC: 4.66 Mil/uL (ref 4.22–5.81)
RDW: 13.6 % (ref 11.5–15.5)
WBC: 7.8 10*3/uL (ref 4.0–10.5)

## 2014-03-15 LAB — URINALYSIS
Bilirubin Urine: NEGATIVE
Hgb urine dipstick: NEGATIVE
KETONES UR: NEGATIVE
LEUKOCYTES UA: NEGATIVE
Nitrite: NEGATIVE
PH: 6 (ref 5.0–8.0)
SPECIFIC GRAVITY, URINE: 1.025 (ref 1.000–1.030)
Total Protein, Urine: NEGATIVE
Urine Glucose: NEGATIVE
Urobilinogen, UA: 0.2 (ref 0.0–1.0)

## 2014-03-15 LAB — LIPID PANEL
CHOLESTEROL: 213 mg/dL — AB (ref 0–200)
HDL: 53.6 mg/dL (ref >39.00)
LDL CALC: 134 mg/dL — AB (ref 0–99)
NonHDL: 159.4
TRIGLYCERIDES: 129 mg/dL (ref 0.0–149.0)
Total CHOL/HDL Ratio: 4
VLDL: 25.8 mg/dL (ref 0.0–40.0)

## 2014-03-15 LAB — TSH: TSH: 2.21 u[IU]/mL (ref 0.35–4.50)

## 2014-03-15 LAB — HEPATIC FUNCTION PANEL
ALT: 39 U/L (ref 0–53)
AST: 37 U/L (ref 0–37)
Albumin: 4.4 g/dL (ref 3.5–5.2)
Alkaline Phosphatase: 124 U/L — ABNORMAL HIGH (ref 39–117)
BILIRUBIN TOTAL: 0.3 mg/dL (ref 0.2–1.2)
Bilirubin, Direct: 0.1 mg/dL (ref 0.0–0.3)
Total Protein: 7.7 g/dL (ref 6.0–8.3)

## 2014-03-15 LAB — BASIC METABOLIC PANEL
BUN: 15 mg/dL (ref 6–23)
CHLORIDE: 104 meq/L (ref 96–112)
CO2: 29 mEq/L (ref 19–32)
Calcium: 9.8 mg/dL (ref 8.4–10.5)
Creatinine, Ser: 0.7 mg/dL (ref 0.4–1.5)
GFR: 117.55 mL/min (ref >60.00)
Glucose, Bld: 146 mg/dL — ABNORMAL HIGH (ref 70–99)
POTASSIUM: 4.8 meq/L (ref 3.5–5.1)
SODIUM: 138 meq/L (ref 135–145)

## 2014-03-15 LAB — VITAMIN B12: VITAMIN B 12: 638 pg/mL (ref 211–911)

## 2014-03-15 LAB — PSA: PSA: 0.41 ng/mL (ref 0.10–4.00)

## 2014-03-15 MED ORDER — PANCRELIPASE (LIP-PROT-AMYL) 10000 UNITS PO CPEP: 1.0000 | ORAL_CAPSULE | Freq: Three times a day (TID) | ORAL | Status: DC

## 2014-03-15 MED ORDER — BUPROPION HCL ER (XL) 150 MG PO TB24: ORAL_TABLET | ORAL | Status: DC

## 2014-03-15 MED ORDER — OMEPRAZOLE 40 MG PO CPDR: 40.0000 mg | DELAYED_RELEASE_CAPSULE | Freq: Every day | ORAL | Status: DC

## 2014-03-15 MED ORDER — BUSPIRONE HCL 15 MG PO TABS: 15.0000 mg | ORAL_TABLET | Freq: Two times a day (BID) | ORAL | Status: DC

## 2014-03-15 NOTE — Progress Notes (Signed)
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  BP Readings from Last 3 Encounters:  03/15/14 170/110  09/21/13 140/88  03/23/13 136/96   Wt Readings from Last 3 Encounters:  03/15/14 176 lb (79.833 kg)  09/21/13 185 lb (83.915 kg)  03/23/13 182 lb 9.6 oz (82.827 kg)     Review of Systems  Constitutional: Negative for appetite change, fatigue and unexpected weight change.  HENT: Negative for congestion, nosebleeds, sneezing, sore throat and trouble swallowing.   Eyes: Negative for itching and visual disturbance.  Respiratory: Negative for cough.   Cardiovascular: Negative for chest pain, palpitations and leg swelling.  Gastrointestinal: Positive for abdominal pain (3/wk chronic). Negative for nausea, diarrhea, blood in stool and abdominal distention.  Genitourinary: Negative for frequency and hematuria.  Musculoskeletal: Negative for back pain, gait problem, joint swelling and neck pain.  Skin: Negative for rash.  Neurological: Negative for dizziness, tremors, speech difficulty and weakness.  Psychiatric/Behavioral: Negative for suicidal ideas, sleep disturbance, self-injury, dysphoric mood and agitation. The patient is nervous/anxious.        Objective:   Physical Exam  Constitutional: He is oriented to person, place, and time. He appears well-developed.  HENT:  Mouth/Throat: Oropharynx is clear and moist.  Eyes: Conjunctivae are normal. Pupils are equal, round, and reactive to light.  Neck: Normal range of motion. No JVD present. No thyromegaly present.  Cardiovascular: Normal rate, regular rhythm, normal heart sounds and intact distal pulses.  Exam reveals no gallop and no friction rub.   No murmur heard. Pulmonary/Chest: Effort  normal and breath sounds normal. No respiratory distress. He has no wheezes. He has no rales. He exhibits no tenderness.  Abdominal: Soft. Bowel sounds are normal. He exhibits no distension and no mass. There is no tenderness. There is no rebound and no guarding.  Genitourinary:  Rectal per GI  Musculoskeletal: Normal range of motion. He exhibits no edema and no tenderness.  Lymphadenopathy:    He has no cervical adenopathy.  Neurological: He is alert and oriented to person, place, and time. He has normal reflexes. No cranial nerve deficit. He exhibits normal muscle tone. Coordination normal.  Skin: Skin is warm and dry. No rash noted.  Psychiatric: He has a normal mood and affect. His behavior is normal. Judgment and thought content normal.  sad   Lab Results  Component Value Date   WBC 5.9 09/12/2012   HGB 14.1 09/12/2012   HCT 41.1 09/12/2012   PLT 208.0 09/12/2012   GLUCOSE 111* 03/23/2013   CHOL 168 09/12/2012   TRIG 96.0 09/12/2012   HDL 35.20* 09/12/2012   LDLDIRECT 149.4 01/12/2011   LDLCALC 114* 09/12/2012   ALT 42 03/23/2013   AST 34 03/23/2013   NA 138 03/23/2013   K 4.3 03/23/2013   CL 107 03/23/2013   CREATININE 0.7 03/23/2013   BUN 13 03/23/2013   CO2 28 03/23/2013   TSH 2.24 09/12/2012   PSA 0.30 09/12/2012   HGBA1C  Value: 5.8 (NOTE)  According to the ADA Clinical Practice Recommendations for 2011, when HbA1c is used as a screening test:   >=6.5%   Diagnostic of Diabetes Mellitus           (if abnormal result  is confirmed)  5.7-6.4%   Increased risk of developing Diabetes Mellitus  References:Diagnosis and Classification of Diabetes Mellitus,Diabetes JQZE,0923,30(QTMAU 1):S62-S69 and Standards of Medical Care in         Diabetes - 2011,Diabetes QJFH,5456,25  (Suppl 1):S11-S61.* 12/22/2009          Assessment & Plan:

## 2014-03-15 NOTE — Assessment & Plan Note (Signed)
Luke White died in June 2015 from post-transplant lymphoma Discussed

## 2014-03-15 NOTE — Assessment & Plan Note (Signed)
Continue with current prn therapy as reflected on the Med list.

## 2014-03-15 NOTE — Progress Notes (Signed)
Pre visit review using our clinic review tool, if applicable. No additional management support is needed unless otherwise documented below in the visit note. 

## 2014-03-22 ENCOUNTER — Encounter: Payer: BC Managed Care – PPO | Admitting: Internal Medicine

## 2014-04-06 ENCOUNTER — Encounter: Payer: Self-pay | Admitting: Internal Medicine

## 2014-04-08 ENCOUNTER — Other Ambulatory Visit: Payer: Self-pay | Admitting: Internal Medicine

## 2014-04-09 ENCOUNTER — Other Ambulatory Visit: Payer: Self-pay | Admitting: Internal Medicine

## 2014-04-09 NOTE — Telephone Encounter (Signed)
Pt requests refill hydrocodone. [facility]/[address]. Pt has a few days left.

## 2014-04-13 ENCOUNTER — Other Ambulatory Visit: Payer: Self-pay | Admitting: Internal Medicine

## 2014-04-13 NOTE — Telephone Encounter (Signed)
Ok to Rf in PCP's absence?  

## 2014-04-13 NOTE — Telephone Encounter (Signed)
Faxed script back to [facility].../lmb 

## 2014-04-13 NOTE — Telephone Encounter (Signed)
Done hardcopy to robin  

## 2014-04-26 MED ORDER — HYDROCODONE-ACETAMINOPHEN 5-325 MG PO TABS: 1.0000 | ORAL_TABLET | Freq: Two times a day (BID) | ORAL | Status: DC | PRN

## 2014-04-29 ENCOUNTER — Encounter: Payer: Self-pay | Admitting: Gastroenterology

## 2014-06-12 ENCOUNTER — Other Ambulatory Visit: Payer: Self-pay | Admitting: Internal Medicine

## 2014-06-14 NOTE — Telephone Encounter (Signed)
MD out of the office this week pls advise on refill...Luke White

## 2014-06-14 NOTE — Telephone Encounter (Signed)
Faxed script back to [facility].../lmb 

## 2014-06-28 ENCOUNTER — Other Ambulatory Visit: Payer: Self-pay | Admitting: Internal Medicine

## 2014-07-09 ENCOUNTER — Encounter: Payer: BC Managed Care – PPO | Admitting: Gastroenterology

## 2014-08-22 ENCOUNTER — Other Ambulatory Visit: Payer: Self-pay | Admitting: Internal Medicine

## 2014-09-13 ENCOUNTER — Ambulatory Visit: Payer: BC Managed Care – PPO | Admitting: Internal Medicine

## 2014-12-09 ENCOUNTER — Emergency Department (HOSPITAL_COMMUNITY)
Admission: EM | Admit: 2014-12-09 | Discharge: 2014-12-10 | Disposition: A | Discharge status: Other Acute Inpatient Hospital | Payer: BLUE CROSS/BLUE SHIELD | Attending: Emergency Medicine | Admitting: Emergency Medicine

## 2014-12-09 ENCOUNTER — Encounter (HOSPITAL_COMMUNITY): Payer: Self-pay | Admitting: Emergency Medicine

## 2014-12-09 DIAGNOSIS — R1110 Vomiting, unspecified: Secondary | ICD-10-CM | POA: Insufficient documentation

## 2014-12-09 DIAGNOSIS — R519 Headache, unspecified: Secondary | ICD-10-CM

## 2014-12-09 DIAGNOSIS — T424X2A Poisoning by benzodiazepines, intentional self-harm, initial encounter: Secondary | ICD-10-CM | POA: Insufficient documentation

## 2014-12-09 DIAGNOSIS — F419 Anxiety disorder, unspecified: Secondary | ICD-10-CM | POA: Diagnosis not present

## 2014-12-09 DIAGNOSIS — F1310 Sedative, hypnotic or anxiolytic abuse, uncomplicated: Secondary | ICD-10-CM | POA: Diagnosis not present

## 2014-12-09 DIAGNOSIS — Y9289 Other specified places as the place of occurrence of the external cause: Secondary | ICD-10-CM | POA: Insufficient documentation

## 2014-12-09 DIAGNOSIS — Z79899 Other long term (current) drug therapy: Secondary | ICD-10-CM | POA: Diagnosis not present

## 2014-12-09 DIAGNOSIS — K219 Gastro-esophageal reflux disease without esophagitis: Secondary | ICD-10-CM | POA: Insufficient documentation

## 2014-12-09 DIAGNOSIS — Y998 Other external cause status: Secondary | ICD-10-CM | POA: Insufficient documentation

## 2014-12-09 DIAGNOSIS — Y9389 Activity, other specified: Secondary | ICD-10-CM | POA: Diagnosis not present

## 2014-12-09 DIAGNOSIS — R51 Headache: Secondary | ICD-10-CM | POA: Diagnosis present

## 2014-12-09 DIAGNOSIS — Z87891 Personal history of nicotine dependence: Secondary | ICD-10-CM | POA: Diagnosis not present

## 2014-12-09 DIAGNOSIS — T50902A Poisoning by unspecified drugs, medicaments and biological substances, intentional self-harm, initial encounter: Secondary | ICD-10-CM

## 2014-12-09 LAB — RAPID URINE DRUG SCREEN, HOSP PERFORMED
Amphetamines: NOT DETECTED
Barbiturates: NOT DETECTED
Benzodiazepines: POSITIVE — AB
Cocaine: NOT DETECTED
Opiates: NOT DETECTED
Tetrahydrocannabinol: NOT DETECTED

## 2014-12-09 LAB — COMPREHENSIVE METABOLIC PANEL
ALT: 49 U/L (ref 17–63)
AST: 72 U/L — ABNORMAL HIGH (ref 15–41)
Albumin: 4.3 g/dL (ref 3.5–5.0)
Alkaline Phosphatase: 131 U/L — ABNORMAL HIGH (ref 38–126)
Anion gap: 11 (ref 5–15)
BUN: 19 mg/dL (ref 6–20)
CO2: 23 mmol/L (ref 22–32)
Calcium: 9 mg/dL (ref 8.9–10.3)
Chloride: 105 mmol/L (ref 101–111)
Creatinine, Ser: 0.81 mg/dL (ref 0.61–1.24)
GFR calc Af Amer: >60 mL/min (ref >60)
GFR calc non Af Amer: >60 mL/min (ref >60)
Glucose, Bld: 157 mg/dL — ABNORMAL HIGH (ref 65–99)
Potassium: 4.2 mmol/L (ref 3.5–5.1)
Sodium: 139 mmol/L (ref 135–145)
Total Bilirubin: 0.8 mg/dL (ref 0.3–1.2)
Total Protein: 7.8 g/dL (ref 6.5–8.1)

## 2014-12-09 LAB — URINALYSIS, ROUTINE W REFLEX MICROSCOPIC
Bilirubin Urine: NEGATIVE
Glucose, UA: NEGATIVE mg/dL
Ketones, ur: NEGATIVE mg/dL
Leukocytes, UA: NEGATIVE
Nitrite: NEGATIVE
Protein, ur: 30 mg/dL — AB
Specific Gravity, Urine: 1.014 (ref 1.005–1.030)
Urobilinogen, UA: 0.2 mg/dL (ref 0.0–1.0)
pH: 5.5 (ref 5.0–8.0)

## 2014-12-09 LAB — CBC WITH DIFFERENTIAL/PLATELET
Basophils Absolute: 0 10*3/uL (ref 0.0–0.1)
Basophils Relative: 0 % (ref 0–1)
Eosinophils Absolute: 0.1 10*3/uL (ref 0.0–0.7)
Eosinophils Relative: 1 % (ref 0–5)
HCT: 47.4 % (ref 39.0–52.0)
Hemoglobin: 15.8 g/dL (ref 13.0–17.0)
Lymphocytes Relative: 14 % (ref 12–46)
Lymphs Abs: 1.5 10*3/uL (ref 0.7–4.0)
MCH: 30.3 pg (ref 26.0–34.0)
MCHC: 33.3 g/dL (ref 30.0–36.0)
MCV: 90.8 fL (ref 78.0–100.0)
Monocytes Absolute: 0.6 10*3/uL (ref 0.1–1.0)
Monocytes Relative: 6 % (ref 3–12)
Neutro Abs: 8.3 10*3/uL — ABNORMAL HIGH (ref 1.7–7.7)
Neutrophils Relative %: 79 % — ABNORMAL HIGH (ref 43–77)
Platelets: ADEQUATE 10*3/uL (ref 150–400)
RBC: 5.22 MIL/uL (ref 4.22–5.81)
RDW: 13 % (ref 11.5–15.5)
WBC: 10.5 10*3/uL (ref 4.0–10.5)

## 2014-12-09 LAB — URINE MICROSCOPIC-ADD ON

## 2014-12-09 LAB — SALICYLATE LEVEL: Salicylate Lvl: <4 mg/dL (ref 2.8–30.0)

## 2014-12-09 LAB — ETHANOL: Alcohol, Ethyl (B): 7 mg/dL — ABNORMAL HIGH (ref <5)

## 2014-12-09 LAB — ACETAMINOPHEN LEVEL: Acetaminophen (Tylenol), Serum: <10 ug/mL — ABNORMAL LOW (ref 10–30)

## 2014-12-09 MED ORDER — PANTOPRAZOLE SODIUM 40 MG PO TBEC
40.0000 mg | DELAYED_RELEASE_TABLET | Freq: Every day | ORAL | Status: DC
  Administered 2014-12-09: 40 mg via ORAL
  Filled 2014-12-09: qty 1

## 2014-12-09 MED ORDER — HYDROCODONE-ACETAMINOPHEN 5-325 MG PO TABS
1.0000 | ORAL_TABLET | Freq: Two times a day (BID) | ORAL | Status: DC | PRN
  Administered 2014-12-09: 1 via ORAL
  Filled 2014-12-09: qty 1

## 2014-12-09 MED ORDER — ALPRAZOLAM 0.5 MG PO TABS: 0.5000 mg | ORAL_TABLET | Freq: Two times a day (BID) | ORAL | Status: DC | PRN

## 2014-12-09 MED ORDER — NICOTINE 21 MG/24HR TD PT24
21.0000 mg | MEDICATED_PATCH | Freq: Once | TRANSDERMAL | Status: DC
  Administered 2014-12-09: 21 mg via TRANSDERMAL
  Filled 2014-12-09: qty 1

## 2014-12-09 MED ORDER — ACETAMINOPHEN 325 MG PO TABS
650.0000 mg | ORAL_TABLET | Freq: Once | ORAL | Status: AC
  Administered 2014-12-09: 650 mg via ORAL
  Filled 2014-12-09: qty 2

## 2014-12-09 MED ORDER — BUPROPION HCL ER (XL) 150 MG PO TB24
150.0000 mg | ORAL_TABLET | Freq: Every day | ORAL | Status: DC
Start: 2014-12-09 — End: 2014-12-10
  Administered 2014-12-09: 150 mg via ORAL
  Filled 2014-12-09: qty 1

## 2014-12-09 MED ORDER — GI COCKTAIL ~~LOC~~
30.0000 mL | Freq: Once | ORAL | Status: AC
  Administered 2014-12-09: 30 mL via ORAL
  Filled 2014-12-09: qty 30

## 2014-12-09 MED ORDER — BUSPIRONE HCL 10 MG PO TABS
15.0000 mg | ORAL_TABLET | Freq: Two times a day (BID) | ORAL | Status: DC
  Administered 2014-12-09: 15 mg via ORAL
  Filled 2014-12-09: qty 2

## 2014-12-09 MED ORDER — TEMAZEPAM 15 MG PO CAPS: 30.0000 mg | ORAL_CAPSULE | Freq: Every evening | ORAL | Status: DC | PRN

## 2014-12-09 NOTE — ED Notes (Signed)
One patient belonging bag placed in locker #26

## 2014-12-09 NOTE — Progress Notes (Signed)
Patient accepted to [facility] room 403/2. Clayborne Dana, RN

## 2014-12-09 NOTE — ED Notes (Signed)
TTS remains at the bedside.

## 2014-12-09 NOTE — ED Notes (Addendum)
Pt given cell phone so that his daughters numbers could be retrieved. Cell phone placed back in locker #26.

## 2014-12-09 NOTE — ED Provider Notes (Signed)
CSN: [id number]     Arrival date & time 12/09/14  1539 History   First MD Initiated Contact with Patient 12/09/14 1551     Chief Complaint  Patient presents with  . Drug Overdose   HPI   54 year old male presents today with drug overdose. Patient states that last night he took approximately 25 temazepam in attempt to end his life. Patient reports that this is the anniversary of his husband passing, reports that he "wants it all to go away", and does not want a "burden his family with his baggage". He reports last night he went out to dinner with friends drinking wine, came home and took one of his temazepam's and attempted fall asleep. Patient reports he is restless woke up with the medicine cabinet and decided that" I just needed to do this" and took estimated 25 of the pills. He reports that through the night he remembers walking through the house, confused. This morning he reached out to one of his close friends and inform them of what he did, they contacted EMS to have him brought to the emergency room. At time of evaluation patient was tearful, and had no acute physical concerns other than his emotional pain. Patient denies any other drug use in addition to prescribe medications and alcohol. Patient denies fever, chills, headache, dizziness, changes in vision, nausea, abdominal pain,, or any other concerning signs or symptoms. Patient reports that he is sleepy at the time my evaluation. He reports that he had multiple episodes of vomiting early this morning, nonbloody.  Past Medical History  Diagnosis Date  . Anxiety   . GERD (gastroesophageal reflux disease)   . Allergic rhinitis   . Pancreatitis 2008    HX of  x2 ? cause   History reviewed. No pertinent past surgical history. Family History  Problem Relation Age of Onset  . Anxiety disorder Other   . Heart disease Neg Hx   . Hypertension Mother   . Hypertension Father   . Hyperlipidemia Father    History  Substance Use Topics  .  Smoking status: Former Research scientist (life sciences)  . Smokeless tobacco: Not on file  . Alcohol Use: No    Review of Systems  All other systems reviewed and are negative.   Allergies  Review of patient's allergies indicates no known allergies.  Home Medications   Prior to Admission medications   Medication Sig Start Date End Date Taking? Authorizing Provider  ALPRAZolam Duanne Moron) 0.5 MG tablet TAKE 1/2 TO 1 TABLET BY MOUTH TWICE DAILY AS NEEDED 06/14/14  Yes Janith Lima, MD  Cholecalciferol (VITAMIN D3) 1000 UNITS tablet Take 1,000 Units by mouth daily.     Yes Historical Provider, MD  omeprazole (PRILOSEC) 40 MG capsule Take 1 capsule (40 mg total) by mouth daily. 03/15/14  Yes Aleksei Plotnikov V, MD  temazepam (RESTORIL) 30 MG capsule TAKE 1 CAPSULE BY MOUTH EVERY DAY AT BEDTIME AS NEEDED FOR SLEEP Patient taking differently: TAKE 1 CAPSULE BY MOUTH EVERY DAY AT BEDTIME 04/13/14  Yes Biagio Borg, MD  buPROPion (WELLBUTRIN XL) 150 MG 24 hr tablet TAKE 1 TABLET BY MOUTH EVERY MORNING Patient not taking: Reported on 12/09/2014 03/15/14   Tyrone Apple Plotnikov V, MD  busPIRone (BUSPAR) 15 MG tablet Take 1 tablet (15 mg total) by mouth 2 (two) times daily. Patient not taking: Reported on 12/09/2014 03/15/14   Cassandria Anger, MD  HYDROcodone-acetaminophen (NORCO/VICODIN) 5-325 MG per tablet Take 1 tablet by mouth 2 (two) times daily as  needed for severe pain. Please fill on or after 10/22/13 Patient not taking: Reported on 12/09/2014 04/26/14   Aleksei Plotnikov V, MD  Pancrelipase, Lip-Prot-Amyl, (ZENPEP) 10000 UNITS CPEP Take 1 capsule (10,000 Units total) by mouth 3 (three) times daily before meals. Patient not taking: Reported on 12/09/2014 03/15/14   Aleksei Plotnikov V, MD  temazepam (RESTORIL) 30 MG capsule TAKE ONE CAPSULE BY MOUTH EVERY NIGHT AT BEDTIME AS NEEDED FOR SLEEP Patient not taking: Reported on 12/09/2014 07/01/14   Aleksei Plotnikov V, MD   BP 143/82 mmHg  Pulse 101  Temp(Src) 97.6 F (36.4 C)  (Oral)  Resp 20  SpO2 99%   Physical Exam  Constitutional: He is oriented to person, place, and time. He appears well-developed and well-nourished.  HENT:  Head: Normocephalic and atraumatic.  Eyes: Conjunctivae and EOM are normal. Pupils are equal, round, and reactive to light.  Neck: Normal range of motion. Neck supple. No JVD present. No tracheal deviation present. No thyromegaly present.  Cardiovascular: Regular rhythm, normal heart sounds and intact distal pulses.  Exam reveals no gallop and no friction rub.   No murmur heard. Pulmonary/Chest: Effort normal and breath sounds normal. No stridor. No respiratory distress. He has no wheezes. He has no rales. He exhibits no tenderness.  Abdominal: Soft. He exhibits no distension and no mass. There is no tenderness. There is no rebound and no guarding.  Musculoskeletal: Normal range of motion.  Lymphadenopathy:    He has no cervical adenopathy.  Neurological: He is alert and oriented to person, place, and time. He has normal strength. No cranial nerve deficit or sensory deficit. He displays a negative Romberg sign. Coordination normal. GCS eye subscore is 4. GCS verbal subscore is 5. GCS motor subscore is 6.  Skin: Skin is warm and dry.  Psychiatric: He has a normal mood and affect. His behavior is normal. Judgment and thought content normal.  Nursing note and vitals reviewed.   ED Course  Procedures (including critical care time) Labs Review Labs Reviewed  ACETAMINOPHEN LEVEL - Abnormal; Notable for the following:    Acetaminophen (Tylenol), Serum <10 (*)    All other components within normal limits  COMPREHENSIVE METABOLIC PANEL - Abnormal; Notable for the following:    Glucose, Bld 157 (*)    AST 72 (*)    Alkaline Phosphatase 131 (*)    All other components within normal limits  CBC WITH DIFFERENTIAL/PLATELET - Abnormal; Notable for the following:    Neutrophils Relative % 79 (*)    Neutro Abs 8.3 (*)    All other components  within normal limits  URINE RAPID DRUG SCREEN, HOSP PERFORMED - Abnormal; Notable for the following:    Benzodiazepines POSITIVE (*)    All other components within normal limits  ETHANOL - Abnormal; Notable for the following:    Alcohol, Ethyl (B) 7 (*)    All other components within normal limits  URINALYSIS, ROUTINE W REFLEX MICROSCOPIC (NOT AT [facility]) - Abnormal; Notable for the following:    Hgb urine dipstick SMALL (*)    Protein, ur 30 (*)    All other components within normal limits  SALICYLATE LEVEL  URINE MICROSCOPIC-ADD ON    Imaging Review No results found.   EKG Interpretation None      MDM   Final diagnoses:  Drug overdose, intentional self-harm, initial encounter  Headache, unspecified headache type    Labs: Acetaminophen, CMP, CBC ethanol, salicylate, urine rapid drug, urinalysis- significant for ethanol 7, benzodiazepine positive  Imaging: None indicated  Consults: TTS  Therapeutics: Tylenol  Assessment: Drug overdose, suicidal ideation, headache  Plan: Patient presents with drug overdose after taking approximately 25 temazepam last night at 2:30. Patient appeared stable throughout his stay in the ED, with no significant findings on physical exam or laboratory data. Patient not actively withdrawing. No signs of seizure, altered mental status, confusion, or any other concerning signs or symptoms at this time. Patient appears to be slightly lethargic during my initial evaluation, improved over his stay in the ED. TTS consult, patient will be admitted for further evaluation and management. Pt medically cleared for transfer. Pt was accepted to [facility] and transferred.       Okey Regal, PA-C 12/09/14 2317  Pamella Pert, MD 12/11/14 1021

## 2014-12-09 NOTE — BH Assessment (Addendum)
Tele Assessment Note   Luke White is an 54 y.o. male. Brought to ED by friend after intentional overdose on 25 sleeping pills. Pt reports today is the anniversary of the death of his husband from complications from liver transplant. Pt reports he also recently lost his father. Pt reports for the last couple of months he has been thinking maybe suicide would be the answer. He reports this is not like him, and he has lost several friends to suicide. Pt reports about three weeks ago he flushed his welbutrin and xanax down the toilet, but last night found a bottle of sleeping pills and took it. At the time of assessment pt was depressed and anxious with appropriate affect. Pt is oriented times four but displaying some trouble with recall of dates and chronological order. Pt reports he generally is very detail oriented and felt foggy on his medication and has continued to have some trouble with focus and recall. Pt denies HI, AVH, SA, and self harm.  Pt reports he has been depressed since the loss of his partner, crying spells, loss of pleasure, weight loss of 32 pounds, trouble sleeping, and SI with planning. Pt reports possible periods of manic or hypomanic sx, and possibly mixed episodes at times.   Pt reports hx of "being a worrier" pt reports recently his chest has hurt and his feels difficult to breath a times. Pt reports hx of trauma, losing his younger brother when he was 57, and going through serious medical crisis with SO. No hx of emotional or physical abuse.   Pt reports he does not use drugs, uses 1-3 drinks of alcohol 2-3 per week.   Pt reports family hx is positive for bipolar.   Pt would like information for OP resources for counseling and possibly group support. Pt is open to inpt tx if that is recommended.   Axis I: 296.23 Major Depressive Disorder, severe without psychotic features Rule out Bipolar II or cyclothymia, 300.00 Unspecified Anxiety Disorder  Past Medical History:  Past  Medical History  Diagnosis Date  . Anxiety   . GERD (gastroesophageal reflux disease)   . Allergic rhinitis   . Pancreatitis 2008    HX of  x2 ? cause    History reviewed. No pertinent past surgical history.  Family History:  Family History  Problem Relation Age of Onset  . Anxiety disorder Other   . Heart disease Neg Hx   . Hypertension Mother   . Hypertension Father   . Hyperlipidemia Father     Social History:  reports that he has quit smoking. He does not have any smokeless tobacco history on file. He reports that he does not drink alcohol or use illicit drugs.  Additional Social History:  Alcohol / Drug Use Pain Medications: see PTA Prescriptions: see PTA reports flushed his welbutrin and Xanax down the toilet about three weeks ago, overdosed on sleeping pills prior to arrival  Over the Counter: See PTA History of alcohol / drug use?: Yes Longest period of sobriety (when/how long): NA, no hx of seizures, one episode of pancreatitis  Negative Consequences of Use:  (none) Withdrawal Symptoms:  (none reported ) Substance #1 Name of Substance 1: etoh  1 - Age of First Use: 20s 1 - Amount (size/oz): 1-3 drinks  1 - Frequency: 2-3 times per week  1 - Duration: years at this level  1 - Last Use / Amount: 12-08-14  CIWA: CIWA-Ar BP: 152/91 mmHg Pulse Rate: 106 COWS:  PATIENT STRENGTHS: (choose at least two) Ability for insight  Support friends and family   Allergies: No Known Allergies  Home Medications:  (Not in a hospital admission)  OB/GYN Status:  No LMP for male patient.  General Assessment Data Location of Assessment: [facility] ED TTS Assessment: In system Is this a Tele or Face-to-Face Assessment?: Face-to-Face Is this an Initial Assessment or a Re-assessment for this encounter?: Initial Assessment Marital status: Widowed Is patient pregnant?: No Pregnancy Status: No Living Arrangements: Alone Can pt return to current living arrangement?: Yes Admission  Status: Voluntary Is patient capable of signing voluntary admission?: Yes Referral Source: Self/Family/Friend Insurance type: [place] Living Arrangements: Alone Name of Psychiatrist: Pervis Hocking  Name of Therapist: none  Education Status Is patient currently in school?: No Current Grade: NA Highest grade of school patient has completed: [employer]  Name of school: NA Contact person: NA  Risk to self with the past 6 months Suicidal Ideation: Yes-Currently Present Has patient been a risk to self within the past 6 months prior to admission? : Yes Suicidal Intent: No-Not Currently/Within Last 6 Months Has patient had any suicidal intent within the past 6 months prior to admission? : Yes Is patient at risk for suicide?: No Suicidal Plan?: Yes-Currently Present Has patient had any suicidal plan within the past 6 months prior to admission? : Yes Specify Current Suicidal Plan: pt took intentional overdose Access to Means: Yes Specify Access to Suicidal Means: took 25 sleeping pills What has been your use of drugs/alcohol within the last 12 months?: Pt reports drinking 1-3 drinks 2-3 times per week Previous Attempts/Gestures: No How many times?: 0 (prior to this attempt) Other Self Harm Risks: none Triggers for Past Attempts: None known Intentional Self Injurious Behavior: None Family Suicide History: No (multiple friends commited suicide) Recent stressful life event(s): Job Loss (loss of partner, and father ) Persecutory voices/beliefs?: No Depression: Yes Depression Symptoms: Despondent, Insomnia, Tearfulness, Guilt, Loss of interest in usual pleasures, Feeling worthless/self pity Substance abuse history and/or treatment for substance abuse?: No Suicide prevention information given to non-admitted patients: Not applicable  Risk to Others within the past 6 months Homicidal Ideation: No Does patient have any lifetime risk of violence toward  others beyond the six months prior to admission? : No Thoughts of Harm to Others: No Current Homicidal Intent: No-Not Currently/Within Last 6 Months Current Homicidal Plan: No Access to Homicidal Means: No Identified Victim: none History of harm to others?: No Assessment of Violence: None Noted Violent Behavior Description: none Does patient have access to weapons?: No Criminal Charges Pending?: No Does patient have a court date: No Is patient on probation?: No  Psychosis Hallucinations: None noted Delusions: None noted  Mental Status Report Appearance/Hygiene: Unremarkable, In scrubs Eye Contact: Good Motor Activity: Unremarkable Speech: Slow Level of Consciousness: Alert Mood: Depressed Affect: Appropriate to circumstance Anxiety Level: Severe Thought Processes: Thought Blocking Judgement: Partial Orientation: Person, Place, Time, Situation Obsessive Compulsive Thoughts/Behaviors: Moderate  Cognitive Functioning Concentration: Decreased Memory: Recent Intact, Remote Intact (some mild impairment lately ) IQ: Average Insight: Good Impulse Control: Fair Appetite: Poor Weight Loss: 32 (in the last year ) Weight Gain: 0 Sleep: Decreased Total Hours of Sleep:  (varies night to night ) Vegetative Symptoms: None  ADLScreening [facility] Assessment Services) Patient's cognitive ability adequate to safely complete daily activities?: Yes Patient able to express need for assistance with ADLs?: Yes Independently performs ADLs?: Yes (appropriate for developmental age)  Prior Inpatient Therapy Prior Inpatient Therapy: No Prior Therapy Dates: NA Prior Therapy Facilty/Provider(s): NA Reason for Treatment: NA  Prior Outpatient Therapy Prior Outpatient Therapy: Yes Prior Therapy Dates: at age 22 and for 41 months Prior Therapy Facilty/Provider(s): Pervis Hocking  Reason for Treatment: loss of SO, depression, anxiety, trouble sleeping  Does patient have an ACCT team?: No Does  patient have Intensive In-House Services?  : No Does patient have [facility] services? : No Does patient have P4CC services?: No  ADL Screening (condition at time of admission) Patient's cognitive ability adequate to safely complete daily activities?: Yes Is the patient deaf or have difficulty hearing?: No Does the patient have difficulty seeing, even when wearing glasses/contacts?: No Does the patient have difficulty concentrating, remembering, or making decisions?: Yes Patient able to express need for assistance with ADLs?: Yes Does the patient have difficulty dressing or bathing?: No Independently performs ADLs?: Yes (appropriate for developmental age) Does the patient have difficulty walking or climbing stairs?: No Weakness of Legs: None Weakness of Arms/Hands: None  Home Assistive Devices/Equipment Home Assistive Devices/Equipment: None    Abuse/Neglect Assessment (Assessment to be complete while patient is alone) Physical Abuse: Denies Verbal Abuse: Denies Sexual Abuse: Denies Exploitation of patient/patient's resources: Denies Self-Neglect: Denies Values / Beliefs Cultural Requests During Hospitalization: None Spiritual Requests During Hospitalization: None   Advance Directives (For Healthcare) Does patient have an advance directive?: No Would patient like information on creating an advanced directive?: No - patient declined information    Additional Information 1:1 In Past 12 Months?: No CIRT Risk: No Elopement Risk: No Does patient have medical clearance?: Yes     Disposition:  Per Arlester Marker NP pt meets inpt criteria for a 400 hall bed and can be accepted to [facility] pending bed availability. Per Delila Pereyra pt can be placed in room 403-2 under the care of Dr. Parke Poisson, arrival time to be coordinated with adult unit. Informed EDP, RN, and Pt. Report to be called to [phone].    Lear Ng, [facility] Triage Specialist 12/09/2014 9:12 PM    Disposition Initial  Assessment Completed for this Encounter: Yes  Nastasha Reising M 12/09/2014 8:51 PM

## 2014-12-09 NOTE — ED Notes (Signed)
Bed: LG49 Expected date:  Expected time:  Means of arrival:  Comments: Ems overdose

## 2014-12-09 NOTE — ED Notes (Signed)
[facility] called for transport to [facility]. [facility] states that it will take 75min due to shift change.

## 2014-12-09 NOTE — BH Assessment (Signed)
Reviewed ED notes prior to initiating assessment. Pt took an intentional overdose, due to anniversary of the loss of his husband.   Assessment to commence shortly.   Lear Ng, [facility] Triage Specialist 12/09/2014 7:33 PM

## 2014-12-09 NOTE — ED Notes (Addendum)
Per EMS-states he took an unknown amount of temazepam about 2 hours ago-states his husband died 2 years ago-states he wanted to go to sleep and not wake up-given 4 mg of Zofran for nausea

## 2014-12-09 NOTE — ED Notes (Signed)
Pt complaining of abdominal pain, in the mid upper area of the abdomen and is rating pain 7 on a scale from 1-10. Pt states that it feels like a knife. Pt states that he takes prilosec at home and has not had a dose this morning.Dr. Aline Brochure notified of pt complaints.

## 2014-12-09 NOTE — ED Notes (Signed)
TTS at the bedside. 

## 2014-12-10 ENCOUNTER — Encounter (HOSPITAL_COMMUNITY): Payer: Self-pay

## 2014-12-10 ENCOUNTER — Inpatient Hospital Stay (HOSPITAL_COMMUNITY)
Admission: AD | Admit: 2014-12-10 | Discharge: 2014-12-12 | DRG: 885 | Disposition: A | Discharge status: Home / Self Care | Payer: BLUE CROSS/BLUE SHIELD | Source: Intra-hospital | Attending: Psychiatry | Admitting: Psychiatry

## 2014-12-10 DIAGNOSIS — Z79899 Other long term (current) drug therapy: Secondary | ICD-10-CM | POA: Diagnosis not present

## 2014-12-10 DIAGNOSIS — F17200 Nicotine dependence, unspecified, uncomplicated: Secondary | ICD-10-CM | POA: Diagnosis present

## 2014-12-10 DIAGNOSIS — T424X2A Poisoning by benzodiazepines, intentional self-harm, initial encounter: Secondary | ICD-10-CM | POA: Diagnosis not present

## 2014-12-10 DIAGNOSIS — T424X1A Poisoning by benzodiazepines, accidental (unintentional), initial encounter: Secondary | ICD-10-CM

## 2014-12-10 DIAGNOSIS — X58XXXA Exposure to other specified factors, initial encounter: Secondary | ICD-10-CM | POA: Diagnosis not present

## 2014-12-10 DIAGNOSIS — F332 Major depressive disorder, recurrent severe without psychotic features: Secondary | ICD-10-CM | POA: Diagnosis present

## 2014-12-10 DIAGNOSIS — K219 Gastro-esophageal reflux disease without esophagitis: Secondary | ICD-10-CM | POA: Diagnosis present

## 2014-12-10 DIAGNOSIS — T1491 Suicide attempt: Secondary | ICD-10-CM | POA: Diagnosis not present

## 2014-12-10 DIAGNOSIS — I10 Essential (primary) hypertension: Secondary | ICD-10-CM | POA: Diagnosis present

## 2014-12-10 DIAGNOSIS — Z9114 Patient's other noncompliance with medication regimen: Secondary | ICD-10-CM | POA: Diagnosis present

## 2014-12-10 MED ORDER — ALUM & MAG HYDROXIDE-SIMETH 200-200-20 MG/5ML PO SUSP: 30.0000 mL | ORAL | Status: DC | PRN

## 2014-12-10 MED ORDER — BUSPIRONE HCL 15 MG PO TABS
15.0000 mg | ORAL_TABLET | Freq: Two times a day (BID) | ORAL | Status: DC
  Administered 2014-12-10 – 2014-12-12 (×5): 15 mg via ORAL
  Filled 2014-12-10 (×10): qty 1

## 2014-12-10 MED ORDER — TRAZODONE HCL 50 MG PO TABS
50.0000 mg | ORAL_TABLET | Freq: Every evening | ORAL | Status: DC | PRN
  Administered 2014-12-10 – 2014-12-11 (×3): 50 mg via ORAL
  Filled 2014-12-10 (×2): qty 1
  Filled 2014-12-10: qty 3
  Filled 2014-12-10: qty 1

## 2014-12-10 MED ORDER — ENSURE ENLIVE PO LIQD
237.0000 mL | Freq: Two times a day (BID) | ORAL | Status: DC
  Administered 2014-12-10 – 2014-12-12 (×4): 237 mL via ORAL

## 2014-12-10 MED ORDER — MAGNESIUM HYDROXIDE 400 MG/5ML PO SUSP: 30.0000 mL | Freq: Every day | ORAL | Status: DC | PRN

## 2014-12-10 MED ORDER — NICOTINE 7 MG/24HR TD PT24
7.0000 mg | MEDICATED_PATCH | Freq: Every day | TRANSDERMAL | Status: DC
  Administered 2014-12-10 – 2014-12-12 (×3): 7 mg via TRANSDERMAL
  Filled 2014-12-10 (×6): qty 1

## 2014-12-10 MED ORDER — HYDROXYZINE HCL 25 MG PO TABS
25.0000 mg | ORAL_TABLET | Freq: Three times a day (TID) | ORAL | Status: DC | PRN
  Administered 2014-12-10 (×2): 25 mg via ORAL
  Filled 2014-12-10: qty 1
  Filled 2014-12-10: qty 8
  Filled 2014-12-10: qty 1

## 2014-12-10 MED ORDER — ACETAMINOPHEN 325 MG PO TABS
650.0000 mg | ORAL_TABLET | Freq: Four times a day (QID) | ORAL | Status: DC | PRN
  Administered 2014-12-10 (×3): 650 mg via ORAL
  Filled 2014-12-10 (×3): qty 2

## 2014-12-10 MED ORDER — BUPROPION HCL ER (XL) 150 MG PO TB24
150.0000 mg | ORAL_TABLET | Freq: Every day | ORAL | Status: DC
  Administered 2014-12-11: 150 mg via ORAL
  Filled 2014-12-10 (×4): qty 1

## 2014-12-10 MED ORDER — LORAZEPAM 1 MG PO TABS
1.0000 mg | ORAL_TABLET | ORAL | Status: DC | PRN
  Administered 2014-12-10 – 2014-12-11 (×3): 1 mg via ORAL
  Filled 2014-12-10 (×3): qty 1

## 2014-12-10 NOTE — Progress Notes (Signed)
Spoke with patient's daughter Gari Crown on phone (consent signed). Conversation about patients care.

## 2014-12-10 NOTE — Progress Notes (Signed)
NUTRITION ASSESSMENT  Pt identified as at risk on the Malnutrition Screen Tool  INTERVENTION: 1. Educated patient on the importance of nutrition and encouraged intake of food and beverages. 2. Discussed weight goals. 3. Supplements: Ensure Enlive po BID, each supplement provides 350 kcal and 20 grams of protein  NUTRITION DIAGNOSIS: Unintentional weight loss related to sub-optimal intake as evidenced by pt report.   Goal: Pt to meet >/= 90% of their estimated nutrition needs.  Monitor:  PO intake  Assessment:  Pt seen for MST. Pt admitted for suicidal ideation with decreased appetite, intakes recently. Per chart review, pt lost 7% body weight in the past 9 months which is not significant for time frame.   Ensure order in place. Likely to meet needs during admission.  54 y.o. male  Height: Ht Readings from Last 1 Encounters:  12/10/14 5' 8.25" (1.734 m)    Weight: Wt Readings from Last 1 Encounters:  12/10/14 164 lb (74.39 kg)    Weight Hx: Wt Readings from Last 10 Encounters:  12/10/14 164 lb (74.39 kg)  03/15/14 176 lb (79.833 kg)  09/21/13 185 lb (83.915 kg)  03/23/13 182 lb 9.6 oz (82.827 kg)  09/15/12 172 lb (78.019 kg)  03/19/12 158 lb (71.668 kg)  07/27/11 171 lb (77.565 kg)  01/15/11 171 lb (77.565 kg)  07/17/10 173 lb (78.472 kg)  01/11/10 166 lb (75.297 kg)    BMI:  Body mass index is 24.74 kg/(m^2). Pt meets criteria for normal weight status based on current BMI.  Estimated Nutritional Needs: Kcal: 25-30 kcal/kg Protein: > 1 gram protein/kg Fluid: 1 ml/kcal  Diet Order: Diet regular Room service appropriate?: Yes; Fluid consistency:: Thin Pt is also offered choice of unit snacks mid-morning and mid-afternoon.  Pt is eating as desired.   Lab results and medications reviewed.    Jarome Matin, RD, LDN Inpatient Clinical Dietitian Pager # [phone] After hours/weekend pager # [phone]

## 2014-12-10 NOTE — Plan of Care (Signed)
Problem: Diagnosis: Increased Risk For Suicide Attempt Goal: LTG-Patient Will Report Improved Mood and Deny Suicidal LTG (by discharge) Patient will report improved mood and deny suicidal ideation.  Outcome: Progressing Patient denies SI

## 2014-12-10 NOTE — Progress Notes (Signed)
Recreation Therapy Notes  Date: 06.17.16 Time: 09:30 am Location: 300 Hall Group Room  Group Topic: Stress Management  Goal Area(s) Addresses:  Patient will verbalize importance of using healthy stress management.  Patient will identify positive emotions associated with healthy stress management.   Intervention: Stress Management  Activity :  Progressive Muscle Relaxation.  LRT introduced and educated patients on stress management technique of progressive muscle relaxation.  A script was used to deliver the technique to patients.  Patients were asked to follow script read a loud by LRT to engage in the stress management technique.  Education:  Stress Management, Discharge Planning.   Clinical Observations/Feedback: Patient did not attend group.    Victorino Sparrow, LRT/CTRS         Ria Comment, Geralyn Figiel A 12/10/2014 1:25 PM

## 2014-12-10 NOTE — [facility] Suicide Risk Assessment (Signed)
[place] INPATIENT:  Family/Significant Other Suicide Prevention Education  Suicide Prevention Education:  Patient Refusal for Family/Significant Other Suicide Prevention Education: The patient Luke White has refused to provide written consent for family/significant other to be provided Family/Significant Other Suicide Prevention Education during admission and/or prior to discharge.  Physician notified.  Concha Pyo 12/10/2014, 12:27 PM

## 2014-12-10 NOTE — B[facility] Counselor (Signed)
Adult Comprehensive Assessment  Patient ID: Luke White, male   DOB: 1960/07/04, 54 y.o.   MRN: [id number]  Information Source: Information source: Patient  Current Stressors:  Educational / Learning stressors: None Employment / Job issues: None Family Relationships: Working through grief of spouse dying a year ago Museum/gallery curator / Lack of resources (include bankruptcy): None Housing / Lack of housing: None Physical health (include injuries & life threatening diseases): None Social relationships: None Substance abuse: Patient reports alcohol use Bereavement / Loss: Spouse died Junew 2015 and father Sep 24, 2014  Living/Environment/Situation:  Living Arrangements: Alone Living conditions (as described by patient or guardian): Good How long has patient lived in current situation?: Six years What is atmosphere in current home: Comfortable  Family History:  Marital status: Widowed Widowed, when?: One year Does patient have children?: Yes How many children?: 2 How is patient's relationship with their children?: Very good  Childhood History:  By whom was/is the patient raised?: Both parents Additional childhood history information: Average childhood Description of patient's relationship with caregiver when they were a child: Okay Patient's description of current relationship with people who raised him/her: Good with mother Number of Siblings: 2 Description of patient's current relationship with siblings: Good Did patient suffer any verbal/emotional/physical/sexual abuse as a child?: No Did patient suffer from severe childhood neglect?: No Has patient ever been sexually abused/assaulted/raped as an adolescent or adult?: No Was the patient ever a victim of a crime or a disaster?: No Witnessed domestic violence?: No Has patient been effected by domestic violence as an adult?: No  Education:  Highest grade of school patient has completed: Three years of college Currently a student?:  No Learning disability?: No  Employment/Work Situation:   Employment situation: Employed Where is patient currently employed?: [employer] How long has patient been employed?: 28 years Patient's job has been impacted by current illness: No What is the longest time patient has a held a job?: 28 years Where was the patient employed at that time?: 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) Has patient ever served in Recruitment consultant?: No  Financial Resources:   Financial resources: Income from employment, Multimedia programmer Does patient have a Programmer, applications or guardian?: No  Alcohol/Substance Abuse:   Alcohol/Substance Abuse Treatment Hx: Denies past history Has alcohol/substance abuse ever caused legal problems?: No  Social Support System:   Pensions consultant Support System: Good Describe Community Support System: Volunteers with [employer] [place] Type of faith/religion: Darrick Meigs How does patient's faith help to cope with current illness?: Attends Social worker and Financial risk analyst:   Leisure and Hobbies: Exercies  Strengths/Needs:   What things does the patient do well?: [place] work history In what areas does patient struggle / problems for patient: None  Discharge Plan:   Does patient have access to transportation?: Yes Will patient be returning to same living situation after discharge?: Yes Currently receiving community mental health services: No Does patient have financial barriers related to discharge medications?: No  Summary/Recommendations:  Luke White is a 54 years old Caucasian male admitted with Major Depression Disorder.  She will benefit from crisis stabilization, evaluation for medication, psycho-education groups for coping skills development, group therapy and case management for discharge planning.     Luke White, Luke White. 12/10/2014

## 2014-12-10 NOTE — [facility] Group Notes (Signed)
[facility] LCSW Aftercare Discharge Planning Group Note   12/10/2014 12:29 PM    Participation Quality:  Appropraite  Mood/Affect:  Appropriate  Depression Rating:  5  Anxiety Rating:  5  Thoughts of Suicide:  No  Will you contract for safety?   NA  Current AVH:  No  Plan for Discharge/Comments:  Patient attended discharge planning group and actively participated in group. He advised of having outpatient provider.  Patient advised of being off his medications for a month.  Suicide prevention education reviewed and SPE document provided.   Transportation Means: Patient has transportation.   Supports:  Patient has a support system.   Luke White, Eulas Post

## 2014-12-10 NOTE — Tx Team (Signed)
Interdisciplinary Treatment Plan Update (Adult)  Date:  12/10/2014  Time Reviewed:  9:36 AM   Progress in Treatment: Attending groups: Patient is attending groups. Participating in groups:  Patient engages in discussion Taking medication as prescribed:  Patient is taking medications Tolerating medication:  Patient is tolerating medications Family/Significant othe contact made:   No, patient declinedcollateral contact Patient understands diagnosis:Yes, patient understands diagnosis and need for treatment Discussing patient identified problems/goals with staff:  Yes, patient is able to express goals/problems Medical problems stabilized or resolved:  Yes Denies suicidal/homicidal ideation: Yes, patient is denying SI/HI. Issues/concerns per patient self-inventory:   Other:  Discharge Plan or Barriers:  Home with follow up with outpatient provider  Reason for Continuation of Hospitalization: Anxiety Depression Medication stabilization   Comments:   Luke White is an 54 y.o. male. Brought to ED by friend after intentional overdose on 25 sleeping pills. Pt reports today is the anniversary of the death of his husband from complications from liver transplant. Pt reports he also recently lost his father. Pt reports for the last couple of months he has been thinking maybe suicide would be the answer. He reports this is not like him, and he has lost several friends to suicide. Pt reports about three weeks ago he flushed his welbutrin and xanax down the toilet, but last night found a bottle of sleeping pills and took it. At the time of assessment pt was depressed and anxious with appropriate affect. Pt is oriented times four but displaying some trouble with recall of dates and chronological order. Pt reports he generally is very detail oriented and felt foggy on his medication and has continued to have some trouble with focus and recall. Pt denies HI, AVH, SA, and self harm. Pt reports he has been  depressed since the loss of his partner, crying spells, loss of pleasure, weight loss of 32 pounds, trouble sleeping, and SI with planning. Pt reports possible periods of manic or hypomanic sx, and possibly mixed episodes at times. Pt reports hx of "being a worrier" pt reports recently his chest has hurt and his feels difficult to breath a times. Pt reports hx of trauma, losing his younger brother when he was 85, and going through serious medical crisis with SO. No hx of emotional or physical abuse. Pt reports he does not use drugs, uses 1-3 drinks of alcohol 2-3 per week.   Additional comments:  Patient and CSW reviewed Patient Discharge Process Letter/Patient Involvement Form.  Patient verbalized understanding and signed form.  Patient and CSW also reviewed and identified patient's goals and treatment plan.  Patient verbalized understanding and agreed to plan.  Estimated length of stay: 3-4 days  New goal(s):  Review of initial/current patient goals per problem list:  Please see plan of careInterdisciplinary Treatment Plan Update (Adult)  Attendees: Patient 12/10/2014 9:36 AM   Family:   12/10/2014 9:36 AM   Physician:  Neita Garnet, MD 12/10/2014 9:36 AM   Nursing:   Talbert Cage, RN 12/10/2014 9:36 AM   Clinical Social Worker:  Joette Catching, LCSW 12/10/2014 9:36 AM   Clinical Social Worker:  Erasmo Downer Drinkard, LCSW-A 12/10/2014 9:36 AM   Case Manager:  Lars Pinks, RN 12/10/2014 9:36 AM   Other:  Festus Aloe, RN 12/10/2014 9:36 AM  Other:   12/10/2014  9:36 AM   Other:  12/10/2014 9:36 AM   Other:  12/10/2014 9:36 AM   Other:  12/10/2014 9:36 AM   Other:  Jake Bathe Transition Team  Coordinator 12/10/2014 9:36 AM   Other:   12/10/2014 9:36 AM   Other:  12/10/2014 9:36 AM   Other:   12/10/2014 9:36 AM    Scribe for Treatment Team:   Concha Pyo, 12/10/2014   9:36 AM

## 2014-12-10 NOTE — H&P (Signed)
Psychiatric Admission Assessment Adult  Patient Identification: Luke White MRN:  [id number] Date of Evaluation:  12/10/2014 Chief Complaint:  Principal Diagnosis: MDD (major depressive disorder), recurrent severe, without psychosis Diagnosis:   Patient Active Problem List   Diagnosis Date Noted  . MDD (major depressive disorder), recurrent severe, without psychosis [F33.2] 12/10/2014  . Grief [F43.21] 03/15/2014  . Well adult exam [Z00.00] 09/18/2012  . DEPRESSIVE DISORDER [F32.9] 09/07/2009  . TOBACCO USE, QUIT [Z87.891] 07/04/2009  . INSOMNIA, PERSISTENT [G47.00] 02/20/2008  . ABDOMINAL PAIN [R10.9] 02/20/2008  . 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 ANXIETY [F41.1] 11/06/2007  . ALLERGIC RHINITIS [J30.9] 11/06/2007  . GERD [K21.9] 11/06/2007  . PANCREATITIS, HX OF [Z87.19] 11/06/2007   History of Present Illness:: 54 year old man, who impulsively  overdosed on  Prescribed Temazepam. States this was not planned out . He states he has been depressed, and recently has been struggling with sense of loss and grief related to death of his SO about a year ago, and more recently feeling upset about tragedy which occurred in [place]. States last night had gone out with friends and had been drinking heavily, and states he thinks his overdose, which he remembers vaguely, occurred in the context of intoxication and would not have occurred if he had not been drinking. States that he spoke with a friend over the phone , who was concerned as patient seemed slurred and non sensical- patient was brought to hospital. He states he had been on Wellbutrin for about one year, and had stopped it a few weeks ago , as he was feeling better.  He does feel it was helping , and was not causing side effects. Elements:  Depression, which patient reports has been relatively mild, triggered at least partially by recent tragic events in [place], and in the context of antidepressant  medication non compliance x several weeks, impulsive suicide attempt, in the context of alcohol intoxication. Associated Signs/Symptoms: Depression Symptoms:  depressed mood, insomnia, suicidal attempt, anxiety,  Denies anhedonia and states his sense of self esteem is "OK" (Hypo) Manic Symptoms:  Denies  Anxiety Symptoms:  Denies panic or agoraphobia, states he does tend to worry excessively. Psychotic Symptoms:denies  PTSD Symptoms: Denies  Total Time spent with patient: 45 minutes   Past Psychiatric History- He denies prior history of severe depression .  Denies any history of self injurious behaviors or prior suicide attempts, no history of violence . No history of psychosis, no history of panic or agoraphobia.  No history of mania. States he had been on Wellbutrin and Buspar for about one year, and they worked well. Stopped these about one month ago. Was also prescribed Temazepam for sleep. No prior psychiatric admissions.  Past Medical History: Had a peripancreatic abscess which was drained 6 years ago. NKDA. Smokes 1/2 PPD. Past Medical History  Diagnosis Date  . Anxiety   . GERD (gastroesophageal reflux disease)   . Allergic rhinitis   . Pancreatitis 2008    HX of  x2 ? cause   History reviewed. No pertinent past surgical history. Family History:  Father  Died 4 months ago , due to COPD.  Mother alive , lives in [place]. Has two brothers.  States father was alcoholic but had been sober x many years. Denies history of family psychiatric illness, no suicides in family. Family History  Problem Relation Age of Onset  . Anxiety disorder Other   . Heart disease Neg Hx   . Hypertension Mother   .  Hypertension Father   . Hyperlipidemia Father    Social History: Divorced, two adult children, 4 grandchildren, lives alone, has two dogs, self employed, no legal issues .   History  Alcohol Use No     History  Drug Use No    History   Social History  . Marital Status: Divorced     Spouse Name: N/A  . Number of Children: 2  . Years of Education: N/A   Occupational History  . STYLIST    Social History Main Topics  . Smoking status: Former Smoker -- 0.25 packs/day for 27 years    Types: Cigarettes  . Smokeless tobacco: Not on file  . Alcohol Use: No  . Drug Use: No  . Sexual Activity: Yes    Birth Control/ Protection: Condom     Comment: permanent partner   Other Topics Concern  . 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   HENT: Negative.   Eyes: Negative.   Respiratory: Negative.   Cardiovascular: Negative.   Gastrointestinal: Negative.   Genitourinary: Negative.   Musculoskeletal: Negative.   Skin: Negative.   Neurological: Negative.  Negative for seizures.  Endo/Heme/Allergies: Negative.   Psychiatric/Behavioral: Positive for depression and suicidal ideas.  all other systems negative   Blood pressure 136/97, pulse 102, temperature 97.8 F (36.6 C), temperature source Oral, resp. rate 18, height 5' 8.25" (1.734 m), weight 164 lb (74.39 kg).Body mass index is 24.74 kg/(m^2).  General Appearance: Well Groomed  Engineer, water::  Good  Speech:  Normal Rate  Volume:  Normal  Mood:  anxious, minimizes depression, states " if anything I am embarrassed about what happenned"   Affect:  Appropriate- slightly anxious   Thought Process:  Goal Directed and Linear  Orientation:  Full (Time, Place, and Person)  Thought Content:  no hallucinations, no delusions, no psychotic symptoms  Suicidal Thoughts:  No at this time denies any thoughts or any plan or intention of hurting self   Homicidal Thoughts:  No  Memory:  recent and remote grossly intact   Judgement:  Fair  Insight:  Present   Psychomotor Activity:  EPS  Concentration:  Good  Recall:  Good  Fund of Knowledge:Good  Language: Good  Akathisia:  Negative  Handed:  Right  AIMS (if indicated):     Assets:  Communication Skills Desire for Improvement Housing Resilience Social Support  ADL's:  Intact  Cognition: WNL  Sleep:  Number of Hours: 2.75   Risk to Self: Is patient at risk for suicide?: Yes Risk to Others:   Prior Inpatient Therapy:   Prior Outpatient Therapy:    Alcohol Screening: 1. How often do you have a drink containing alcohol?: Monthly or less 2. How many drinks containing alcohol do you have on a typical day when you are drinking?: 1 or 2 3. How often do you have six or more drinks on one occasion?: Never Preliminary Score: 0 9. Have you or someone else been injured as a result of your drinking?: No 10. Has a relative or friend or a doctor or another health worker been concerned about your drinking or suggested you cut down?: No Alcohol Use Disorder Identification Test Final Score (AUDIT): 1 Brief Intervention: AUDIT score less than 7 or  less-screening does not suggest unhealthy drinking-brief intervention not indicated  Allergies:  No Known Allergies Lab Results:  Results for orders placed or performed during the hospital encounter of 12/09/14 (from the past 48 hour(s))  Urine rapid drug screen (hosp performed)not at [facility]     Status: Abnormal   Collection Time: 12/09/14  4:29 PM  Result Value Ref Range   Opiates NONE DETECTED NONE DETECTED   Cocaine NONE DETECTED NONE DETECTED   Benzodiazepines POSITIVE (A) NONE DETECTED   Amphetamines NONE DETECTED NONE DETECTED   Tetrahydrocannabinol NONE DETECTED NONE DETECTED   Barbiturates NONE DETECTED NONE DETECTED    Comment:        DRUG SCREEN FOR MEDICAL PURPOSES ONLY.  IF CONFIRMATION IS NEEDED FOR ANY PURPOSE, NOTIFY LAB WITHIN 5 DAYS.        LOWEST DETECTABLE LIMITS FOR URINE DRUG SCREEN Drug Class       Cutoff (ng/mL) Amphetamine       1000 Barbiturate      200 Benzodiazepine   387 Tricyclics       564 Opiates          300 Cocaine          300 THC              50   Urinalysis, Routine w reflex microscopic (not at [facility])     Status: Abnormal   Collection Time: 12/09/14  4:29 PM  Result Value Ref Range   Color, Urine YELLOW YELLOW   APPearance CLEAR CLEAR   Specific Gravity, Urine 1.014 1.005 - 1.030   pH 5.5 5.0 - 8.0   Glucose, UA NEGATIVE NEGATIVE mg/dL   Hgb urine dipstick SMALL (A) NEGATIVE   Bilirubin Urine NEGATIVE NEGATIVE   Ketones, ur NEGATIVE NEGATIVE mg/dL   Protein, ur 30 (A) NEGATIVE mg/dL   Urobilinogen, UA 0.2 0.0 - 1.0 mg/dL   Nitrite NEGATIVE NEGATIVE   Leukocytes, UA NEGATIVE NEGATIVE  Urine microscopic-add on     Status: None   Collection Time: 12/09/14  4:29 PM  Result Value Ref Range   Squamous Epithelial / LPF RARE RARE   RBC / HPF 0-2 <3 RBC/hpf   Urine-Other MUCOUS PRESENT   Acetaminophen level     Status: Abnormal   Collection Time: 12/09/14  4:55 PM  Result Value Ref Range   Acetaminophen (Tylenol), Serum <10 (L) 10 - 30 ug/mL    Comment:        THERAPEUTIC CONCENTRATIONS VARY SIGNIFICANTLY. A RANGE OF 10-30 ug/mL MAY BE AN EFFECTIVE CONCENTRATION FOR MANY PATIENTS. HOWEVER, SOME ARE BEST TREATED AT CONCENTRATIONS OUTSIDE THIS RANGE. ACETAMINOPHEN CONCENTRATIONS >150 ug/mL AT 4 HOURS AFTER INGESTION AND >50 ug/mL AT 12 HOURS AFTER INGESTION ARE OFTEN ASSOCIATED WITH TOXIC REACTIONS.   Comprehensive metabolic panel     Status: Abnormal   Collection Time: 12/09/14  4:55 PM  Result Value Ref Range   Sodium 139 135 - 145 mmol/L   Potassium 4.2 3.5 - 5.1 mmol/L   Chloride 105 101 - 111 mmol/L   CO2 23 22 - 32 mmol/L   Glucose, Bld 157 (H) 65 - 99 mg/dL   BUN 19 6 - 20 mg/dL   Creatinine, Ser 0.81 0.61 - 1.24 mg/dL   Calcium 9.0 8.9 - 10.3 mg/dL   Total Protein 7.8 6.5 - 8.1 g/dL   Albumin 4.3 3.5 - 5.0 g/dL   AST 72 (H) 15 - 41 U/L   ALT 49 17 - 63 U/L  Alkaline Phosphatase 131 (H) 38 - 126 U/L   Total Bilirubin 0.8 0.3 - 1.2 mg/dL   GFR calc non Af Amer >60 >60 mL/min   GFR calc Af Amer >60 >60 mL/min    Comment: (NOTE) The eGFR has been calculated using the CKD EPI equation. This calculation has not been validated in all clinical situations. eGFR's persistently <60 mL/min signify possible Chronic Kidney Disease.    Anion gap 11 5 - 15  CBC WITH DIFFERENTIAL     Status: Abnormal   Collection Time: 12/09/14  4:55 PM  Result Value Ref Range   WBC 10.5 4.0 - 10.5 K/uL    Comment: WHITE COUNT CONFIRMED ON SMEAR   RBC 5.22 4.22 - 5.81 MIL/uL   Hemoglobin 15.8 13.0 - 17.0 g/dL   HCT 47.4 39.0 - 52.0 %   MCV 90.8 78.0 - 100.0 fL   MCH 30.3 26.0 - 34.0 pg   MCHC 33.3 30.0 - 36.0 g/dL   RDW 13.0 11.5 - 15.5 %   Platelets  150 - 400 K/uL    PLATELET CLUMPS NOTED ON SMEAR, COUNT APPEARS ADEQUATE    Comment: SPECIMEN CHECKED FOR CLOTS PLATELET COUNT CONFIRMED BY SMEAR REPEATED TO VERIFY    Neutrophils Relative % 79 (H) 43 - 77 %   Lymphocytes Relative 14 12 - 46 %   Monocytes Relative 6 3 - 12 %   Eosinophils Relative 1 0 - 5 %   Basophils Relative 0 0 - 1 %   Neutro Abs 8.3 (H) 1.7 - 7.7 K/uL   Lymphs Abs 1.5 0.7 - 4.0 K/uL   Monocytes Absolute 0.6 0.1 - 1.0 K/uL   Eosinophils Absolute 0.1 0.0 - 0.7 K/uL   Basophils Absolute 0.0 0.0 - 0.1 K/uL   Smear Review MORPHOLOGY UNREMARKABLE   Ethanol     Status: Abnormal   Collection Time: 12/09/14  4:55 PM  Result Value Ref Range   Alcohol, Ethyl (B) 7 (H) <5 mg/dL    Comment:        LOWEST DETECTABLE LIMIT FOR SERUM ALCOHOL IS 5 mg/dL FOR MEDICAL PURPOSES ONLY   Salicylate level     Status: None   Collection Time: 12/09/14  4:55 PM  Result Value Ref Range   Salicylate Lvl <1.3 2.8 - 30.0 mg/dL   Current Medications: Current Facility-Administered Medications  Medication Dose Route Frequency Provider Last Rate Last Dose  . acetaminophen (TYLENOL) tablet 650 mg  650 mg Oral Q6H  PRN Harriet Butte, NP   650 mg at 12/10/14 0932  . alum & mag hydroxide-simeth (MAALOX/MYLANTA) 200-200-20 MG/5ML suspension 30 mL  30 mL Oral Q4H PRN Harriet Butte, NP      . busPIRone (BUSPAR) tablet 15 mg  15 mg Oral BID Harriet Butte, NP   15 mg at 12/10/14 0804  . feeding supplement (ENSURE ENLIVE) (ENSURE ENLIVE) liquid 237 mL  237 mL Oral BID BM Myer Peer Treena Cosman, MD   237 mL at 12/10/14 0932  . hydrOXYzine (ATARAX/VISTARIL) tablet 25 mg  25 mg Oral TID PRN Harriet Butte, NP   25 mg at 12/10/14 0252  . magnesium hydroxide (MILK OF MAGNESIA) suspension 30 mL  30 mL Oral Daily PRN Harriet Butte, NP      . nicotine (NICODERM CQ - dosed in mg/24 hr) patch 7 mg  7 mg Transdermal Daily Jenne Campus, MD   7 mg at 12/10/14 0804  . traZODone (DESYREL) tablet 50 mg  50 mg Oral QHS PRN Harriet Butte, NP   50 mg at 12/10/14 0252   PTA Medications: Prescriptions prior to admission  Medication Sig Dispense Refill Last Dose  . busPIRone (BUSPAR) 15 MG tablet Take 1 tablet (15 mg total) by mouth 2 (two) times daily. 180 tablet 3 12/10/2014 at Unknown time  . HYDROcodone-acetaminophen (NORCO/VICODIN) 5-325 MG per tablet Take 1 tablet by mouth 2 (two) times daily as needed for severe pain. Please fill on or after 10/22/13 120 tablet 0 12/10/2014 at Unknown time  . omeprazole (PRILOSEC) 40 MG capsule Take 1 capsule (40 mg total) by mouth daily. 90 capsule 2 12/10/2014 at Unknown time  . temazepam (RESTORIL) 30 MG capsule TAKE 1 CAPSULE BY MOUTH EVERY DAY AT BEDTIME AS NEEDED FOR SLEEP (Patient taking differently: TAKE 1 CAPSULE BY MOUTH EVERY DAY AT BEDTIME) 90 capsule 0 12/09/2014 at Unknown time  . temazepam (RESTORIL) 30 MG capsule TAKE ONE CAPSULE BY MOUTH EVERY NIGHT AT BEDTIME AS NEEDED FOR SLEEP 90 capsule 1 12/09/2014 at Unknown time  . ALPRAZolam (XANAX) 0.5 MG tablet TAKE 1/2 TO 1 TABLET BY MOUTH TWICE DAILY AS NEEDED 60 tablet 0 More than a month at Unknown time  . buPROPion (WELLBUTRIN XL)  150 MG 24 hr tablet TAKE 1 TABLET BY MOUTH EVERY MORNING (Patient not taking: Reported on 12/09/2014) 90 tablet 3 More than a month at Unknown time  . Cholecalciferol (VITAMIN D3) 1000 UNITS tablet Take 1,000 Units by mouth daily.     More than a month at Unknown time  . Pancrelipase, Lip-Prot-Amyl, (ZENPEP) 10000 UNITS CPEP Take 1 capsule (10,000 Units total) by mouth 3 (three) times daily before meals. (Patient not taking: Reported on 12/09/2014) 270 capsule 3 More than a month at Unknown time    Previous Psychotropic Medications: in the past had been on Wellbutrin XL and Buspar combination, he feels these medications helped- he had stopped them about one month ago. Also on Temazepam for sleep, over the last year .  Substance Abuse History in the last 12 months:  States he has been drinking 3-4 times per week, over recent weeks , mostly wine . Denies any history of drug abuse .    Consequences of Substance Abuse: denies -  no history of WDL, no history of seizures, no history of DUIs, no history of blackouts  Results for orders placed or performed during the hospital encounter of 12/09/14 (from the past 72 hour(s))  Urine rapid drug screen (hosp performed)not at [facility]     Status: Abnormal   Collection Time: 12/09/14  4:29 PM  Result Value Ref Range   Opiates NONE DETECTED NONE DETECTED   Cocaine NONE DETECTED NONE DETECTED   Benzodiazepines POSITIVE (A) NONE DETECTED   Amphetamines NONE DETECTED NONE DETECTED   Tetrahydrocannabinol NONE DETECTED NONE DETECTED   Barbiturates NONE DETECTED NONE DETECTED    Comment:        DRUG SCREEN FOR MEDICAL PURPOSES ONLY.  IF CONFIRMATION IS NEEDED FOR ANY PURPOSE, NOTIFY LAB WITHIN 5 DAYS.        LOWEST DETECTABLE LIMITS FOR URINE DRUG SCREEN Drug Class       Cutoff (ng/mL) Amphetamine      1000 Barbiturate      200 Benzodiazepine   832 Tricyclics       549 Opiates          300 Cocaine          300 THC  50   Urinalysis,  Routine w reflex microscopic (not at [facility])     Status: Abnormal   Collection Time: 12/09/14  4:29 PM  Result Value Ref Range   Color, Urine YELLOW YELLOW   APPearance CLEAR CLEAR   Specific Gravity, Urine 1.014 1.005 - 1.030   pH 5.5 5.0 - 8.0   Glucose, UA NEGATIVE NEGATIVE mg/dL   Hgb urine dipstick SMALL (A) NEGATIVE   Bilirubin Urine NEGATIVE NEGATIVE   Ketones, ur NEGATIVE NEGATIVE mg/dL   Protein, ur 30 (A) NEGATIVE mg/dL   Urobilinogen, UA 0.2 0.0 - 1.0 mg/dL   Nitrite NEGATIVE NEGATIVE   Leukocytes, UA NEGATIVE NEGATIVE  Urine microscopic-add on     Status: None   Collection Time: 12/09/14  4:29 PM  Result Value Ref Range   Squamous Epithelial / LPF RARE RARE   RBC / HPF 0-2 <3 RBC/hpf   Urine-Other MUCOUS PRESENT   Acetaminophen level     Status: Abnormal   Collection Time: 12/09/14  4:55 PM  Result Value Ref Range   Acetaminophen (Tylenol), Serum <10 (L) 10 - 30 ug/mL    Comment:        THERAPEUTIC CONCENTRATIONS VARY SIGNIFICANTLY. A RANGE OF 10-30 ug/mL MAY BE AN EFFECTIVE CONCENTRATION FOR MANY PATIENTS. HOWEVER, SOME ARE BEST TREATED AT CONCENTRATIONS OUTSIDE THIS RANGE. ACETAMINOPHEN CONCENTRATIONS >150 ug/mL AT 4 HOURS AFTER INGESTION AND >50 ug/mL AT 12 HOURS AFTER INGESTION ARE OFTEN ASSOCIATED WITH TOXIC REACTIONS.   Comprehensive metabolic panel     Status: Abnormal   Collection Time: 12/09/14  4:55 PM  Result Value Ref Range   Sodium 139 135 - 145 mmol/L   Potassium 4.2 3.5 - 5.1 mmol/L   Chloride 105 101 - 111 mmol/L   CO2 23 22 - 32 mmol/L   Glucose, Bld 157 (H) 65 - 99 mg/dL   BUN 19 6 - 20 mg/dL   Creatinine, Ser 0.81 0.61 - 1.24 mg/dL   Calcium 9.0 8.9 - 10.3 mg/dL   Total Protein 7.8 6.5 - 8.1 g/dL   Albumin 4.3 3.5 - 5.0 g/dL   AST 72 (H) 15 - 41 U/L   ALT 49 17 - 63 U/L   Alkaline Phosphatase 131 (H) 38 - 126 U/L   Total Bilirubin 0.8 0.3 - 1.2 mg/dL   GFR calc non Af Amer >60 >60 mL/min   GFR calc Af Amer >60 >60 mL/min     Comment: (NOTE) The eGFR has been calculated using the CKD EPI equation. This calculation has not been validated in all clinical situations. eGFR's persistently <60 mL/min signify possible Chronic Kidney Disease.    Anion gap 11 5 - 15  CBC WITH DIFFERENTIAL     Status: Abnormal   Collection Time: 12/09/14  4:55 PM  Result Value Ref Range   WBC 10.5 4.0 - 10.5 K/uL    Comment: WHITE COUNT CONFIRMED ON SMEAR   RBC 5.22 4.22 - 5.81 MIL/uL   Hemoglobin 15.8 13.0 - 17.0 g/dL   HCT 47.4 39.0 - 52.0 %   MCV 90.8 78.0 - 100.0 fL   MCH 30.3 26.0 - 34.0 pg   MCHC 33.3 30.0 - 36.0 g/dL   RDW 13.0 11.5 - 15.5 %   Platelets  150 - 400 K/uL    PLATELET CLUMPS NOTED ON SMEAR, COUNT APPEARS ADEQUATE    Comment: SPECIMEN CHECKED FOR CLOTS PLATELET COUNT CONFIRMED BY SMEAR REPEATED TO VERIFY    Neutrophils Relative % 79 (H) 43 -  77 %   Lymphocytes Relative 14 12 - 46 %   Monocytes Relative 6 3 - 12 %   Eosinophils Relative 1 0 - 5 %   Basophils Relative 0 0 - 1 %   Neutro Abs 8.3 (H) 1.7 - 7.7 K/uL   Lymphs Abs 1.5 0.7 - 4.0 K/uL   Monocytes Absolute 0.6 0.1 - 1.0 K/uL   Eosinophils Absolute 0.1 0.0 - 0.7 K/uL   Basophils Absolute 0.0 0.0 - 0.1 K/uL   Smear Review MORPHOLOGY UNREMARKABLE   Ethanol     Status: Abnormal   Collection Time: 12/09/14  4:55 PM  Result Value Ref Range   Alcohol, Ethyl (B) 7 (H) <5 mg/dL    Comment:        LOWEST DETECTABLE LIMIT FOR SERUM ALCOHOL IS 5 mg/dL FOR MEDICAL PURPOSES ONLY   Salicylate level     Status: None   Collection Time: 12/09/14  4:55 PM  Result Value Ref Range   Salicylate Lvl <7.9 2.8 - 30.0 mg/dL    Observation Level/Precautions:  15 minute checks  Laboratory:  if needed   Psychotherapy:  Milieu, support, encouragement, group therapy  Medications:  He has done well on wellbutrin XL- no side effects- will restart at 150 mgrs QAM. We reviewed side effects , including increased risk of seizures ( has no history of seizures ). Restart  Buspar 15 mgrs BID for anxiety. Ativan PRNS for possible BZD /Alcohol WDL.  Consultations:  As needed   Discharge Concerns:  -  Estimated LOS: 4 days   Other:     Psychological Evaluations: No   Treatment Plan Summary: Daily contact with patient to assess and evaluate symptoms and progress in treatment, Medication management, Plan admit to inpatient unit  and medications as above   Medical Decision Making:  Review of Psycho-Social Stressors (1), Review or order clinical lab tests (1), Established Problem, Worsening (2) and Review of Medication Regimen & Side Effects (2)  I certify that inpatient services furnished can reasonably be expected to improve the patient's condition.   Neita Garnet 6/17/201612:07 PM

## 2014-12-10 NOTE — Progress Notes (Signed)
Admission Note:  54 yr male who presents [facility] in no acute distress for the treatment of SI and anxiety. Pt appears flat and depressed. Pt was calm and cooperative with admission process. Pt presents with passive SI and contracts for safety upon admission. Pt denies AVH . Pt stated he just felt overwhelmed with the anniversary of his husbands death yesterday, and the passing of his father before [place] this year caused a lot of strain on pt.   A: Skin was assessed and found to be clear of any abnormal marks . PT searched and no contraband found, POC and unit policies explained and understanding verbalized. Consents obtained. Food and fluids offered, and fluids accepted.  R: Pt had no additional questions or concerns.

## 2014-12-10 NOTE — BHH Suicide Risk Assessment (Signed)
[facility] Admission Suicide Risk Assessment   Nursing information obtained from:    Demographic factors:   54 year old man, divorced, SO died earlier this year, employed  Current Mental Status:   see below  Loss Factors:   death of SO earlier this year,death of his father a few months ago Historical Factors:   depression, anxiety Risk Reduction Factors:   resilience, employed, coping skills  Total Time spent w it patient - 45 minutes  Principal Problem- Overdose on Benzodiazepine Diagnosis:   Patient Active Problem List   Diagnosis Date Noted  . MDD (major depressive disorder), recurrent severe, without psychosis [F33.2] 12/10/2014  . Overdose of benzodiazepine [T42.4X1A] 12/10/2014  . Grief [F43.21] 03/15/2014  . Well adult exam [Z00.00] 09/18/2012  . DEPRESSIVE DISORDER [F32.9] 09/07/2009  . TOBACCO USE, QUIT [Z87.891] 07/04/2009  . INSOMNIA, PERSISTENT [G47.00] 02/20/2008  . ABDOMINAL PAIN [R10.9] 02/20/2008  . 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 ANXIETY [F41.1] 11/06/2007  . ALLERGIC RHINITIS [J30.9] 11/06/2007  . GERD [K21.9] 11/06/2007  . PANCREATITIS, HX OF [Z87.19] 11/06/2007     Continued Clinical Symptoms:  Alcohol Use Disorder Identification Test Final Score (AUDIT): 1 The "Alcohol Use Disorders Identification Test", Guidelines for Use in Primary Care, Second Edition.  World Pharmacologist [facility]). Score between 0-7:  no or low risk or alcohol related problems. Score between 8-15:  moderate risk of alcohol related problems. Score between 16-19:  high risk of alcohol related problems. Score 20 or above:  warrants further diagnostic evaluation for alcohol dependence and treatment.   CLINICAL FACTORS:  54 year old male, history of depression, which he characterizes as mild, and improved on Wellbutrin XL. Had stopped this medication a few weeks ago.  Has been struggling with increased depression related to death of SO and of his father  earlier this year. Recently drinking more heavily over recent weeks. Impulsively overdosed on prescribed Temazepam in the context of alcohol intoxication.   Musculoskeletal: Strength & Muscle Tone: within normal limits Gait & Station: normal Patient leans: N/A  Psychiatric Specialty Exam: Physical Exam  ROS  Blood pressure 136/97, pulse 102, temperature 97.8 F (36.6 C), temperature source Oral, resp. rate 18, height 5' 8.25" (1.734 m), weight 164 lb (74.39 kg).Body mass index is 24.74 kg/(m^2).  See Admit Note MSE                                                        COGNITIVE FEATURES THAT CONTRIBUTE TO RISK:  Closed-mindedness    SUICIDE RISK:   Moderate:  Frequent suicidal ideation with limited intensity, and duration, some specificity in terms of plans, no associated intent, good self-control, limited dysphoria/symptomatology, some risk factors present, and identifiable protective factors, including available and accessible social support.  PLAN OF CARE: Patient will be admitted to inpatient psychiatric unit for stabilization and safety. Will provide and encourage milieu participation. Provide medication management and maked adjustments as needed.  Will also provide medication management (PRN) in case he develops withdrawal symptoms. Will follow daily.    Medical Decision Making:  New problem, with additional work up planned, Review of Psycho-Social Stressors (1), Established Problem, Worsening (2) and Review of New Medication or Change in Dosage (2)  I certify that inpatient services furnished can reasonably be expected to improve the patient's condition.  COBOS, FERNANDO 12/10/2014, 12:51 PM

## 2014-12-10 NOTE — Progress Notes (Signed)
D: Behavior appropriate, Pleasant on approach. Per patient self inventory form, patient reports he slept poorly last night. He reports fair appetite, normal energy level and good concentration. He rates depression 4/10, hopelessness 4/10, and anxiety 4/10-all on 1-10 scale, 10 being the worse. He denies SI/HI. Denies A/V/H. He reports abdominal pain. "I am very interested in talking to the doctor about all my medications and depression."   A: Medication administered per MD orders (see eMAR). PRN medication given for pain. Special checks q 15 mins in place for safety.    R: Compliant with medication regimen. Safety maintained.

## 2014-12-10 NOTE — Progress Notes (Signed)
Adult Psychoeducational Group Note  Date:  12/10/2014 Time:  9:49 PM  Group Topic/Focus:  Wrap-Up Group:   The focus of this group is to help patients review their daily goal of treatment and discuss progress on daily workbooks.  Participation Level:  Active  Participation Quality:  Appropriate  Affect:  Appropriate  Cognitive:  Alert  Insight: Appropriate  Engagement in Group:  Engaged  Modes of Intervention:  Discussion  Additional Comments:  Pt stated that he had a good day. He enjoyed the groups and his meeting with the doctor went well.   Sharmon Revere 12/10/2014, 9:49 PM

## 2014-12-10 NOTE — Progress Notes (Signed)
Patient put in 72 hour notice for discharge 12/10/2014 @ 11:50 ; MD notified.

## 2014-12-10 NOTE — Tx Team (Signed)
Initial Interdisciplinary Treatment Plan   PATIENT STRESSORS: Loss of family, significant other Medication change or noncompliance Traumatic event   PATIENT STRENGTHS: Ability for insight Average or above average intelligence Financial means General fund of knowledge Motivation for treatment/growth Supportive family/friends   PROBLEM LIST: Problem List/Patient Goals Date to be addressed Date deferred Reason deferred Estimated date of resolution  Risk for suicide 12/10/14     "help with loss of husband" 12/10/14      anxiety                                           DISCHARGE CRITERIA:  Improved stabilization in mood, thinking, and/or behavior Motivation to continue treatment in a less acute level of care Verbal commitment to aftercare and medication compliance  PRELIMINARY DISCHARGE PLAN: Attend aftercare/continuing care group Outpatient therapy  PATIENT/FAMIILY INVOLVEMENT: This treatment plan has been presented to and reviewed with the patient, Luke White.  The patient and family have been given the opportunity to ask questions and make suggestions.  Vonzella Nipple A 12/10/2014, 2:20 AM

## 2014-12-10 NOTE — BHH Group Notes (Signed)
[employer] LCSW Group Therapy  Feelings Around Relapse 1:15 -2:30        12/10/2014   Type of Therapy:  Group Therapy  Participation Level:  Appropriate  Participation Quality:  Appropriate  Affect:  Appropriate  Cognitive:   Appropriate  Insight:  Developing/Improving  Engagement in Therapy: Developing/Improving  Modes of Intervention:  Discussion Exploration Problem-Solving Supportive  Summary of Progress/Problems:  The topic for today was feelings around relapse.    Patient processed feelings toward relapse and was able to relate to peers. He shared relapsing for him is stopping his medications. Patient identified coping skills that can be used to prevent a relapse including staying on meds and moving forward from the death of his spouse last 01-11-23.   Concha Pyo 12/10/2014

## 2014-12-11 MED ORDER — LORAZEPAM 1 MG PO TABS: 1.0000 mg | ORAL_TABLET | Freq: Four times a day (QID) | ORAL | Status: DC | PRN

## 2014-12-11 MED ORDER — LORAZEPAM 1 MG PO TABS
1.0000 mg | ORAL_TABLET | Freq: Three times a day (TID) | ORAL | Status: DC
  Administered 2014-12-12 (×2): 1 mg via ORAL
  Filled 2014-12-11 (×2): qty 1

## 2014-12-11 MED ORDER — ADULT MULTIVITAMIN W/MINERALS CH
1.0000 | ORAL_TABLET | Freq: Every day | ORAL | Status: DC
  Administered 2014-12-11 – 2014-12-12 (×2): 1 via ORAL
  Filled 2014-12-11 (×5): qty 1

## 2014-12-11 MED ORDER — VITAMIN B-1 100 MG PO TABS
100.0000 mg | ORAL_TABLET | Freq: Every day | ORAL | Status: DC
Start: 2014-12-12 — End: 2014-12-12
  Administered 2014-12-12: 100 mg via ORAL
  Filled 2014-12-11 (×3): qty 1

## 2014-12-11 MED ORDER — LORAZEPAM 1 MG PO TABS
1.0000 mg | ORAL_TABLET | Freq: Four times a day (QID) | ORAL | Status: AC
  Administered 2014-12-11 (×3): 1 mg via ORAL
  Filled 2014-12-11 (×3): qty 1

## 2014-12-11 MED ORDER — LOPERAMIDE HCL 2 MG PO CAPS: 2.0000 mg | ORAL_CAPSULE | ORAL | Status: DC | PRN

## 2014-12-11 MED ORDER — LORAZEPAM 1 MG PO TABS: 1.0000 mg | ORAL_TABLET | Freq: Two times a day (BID) | ORAL | Status: DC

## 2014-12-11 MED ORDER — THIAMINE HCL 100 MG/ML IJ SOLN
100.0000 mg | Freq: Once | INTRAMUSCULAR | Status: AC
  Administered 2014-12-11: 100 mg via INTRAMUSCULAR
  Filled 2014-12-11: qty 2

## 2014-12-11 MED ORDER — LORAZEPAM 1 MG PO TABS: 1.0000 mg | ORAL_TABLET | Freq: Every day | ORAL | Status: DC

## 2014-12-11 MED ORDER — ONDANSETRON 4 MG PO TBDP: 4.0000 mg | ORAL_TABLET | Freq: Four times a day (QID) | ORAL | Status: DC | PRN

## 2014-12-11 NOTE — Progress Notes (Signed)
The focus of this group is to help patients review their daily goal of treatment and discuss progress on daily workbooks. Pt attended the evening group session and responded to all discussion prompts from the [place]. Pt shared that today was overall a great day on the unit. Pt cited having good groups and feeling more like himself. "I just felt great today, like a fog has finally started to lift." Pt attributed this improvement to his medications. Pt's only additional request from Nursing Staff this evening was for towels, which were given to him following group. Pt's affect was appropriate.

## 2014-12-11 NOTE — Progress Notes (Signed)
D: Pt denies SI/HI/AVH. Pt is pleasant and cooperative. Pt stated he is starting to feel better and better. Pt regrets taking himself off his meds and how he kept feeling worse and worse.   A: Pt was offered support and encouragement. Pt was given scheduled medications. Pt was encourage to attend groups. Q 15 minute checks were done for safety.   R:Pt attends groups and interacts well with peers and staff. Pt is taking medication. Pt has no complaints at this time .Pt receptive to treatment and safety maintained on unit.

## 2014-12-11 NOTE — Progress Notes (Signed)
Luke White is doing very well today. He is seen out in the milieu..he has a bright affect. He makes good eye contact. He is calm, contrilled and poised. He completed his daily assessment and on it he wrote he denied si and he rated his depression, hopelessness and anxiety " 2/2/3", respectively.   A He has voiced no complaints at all to this writer  Today. HE is started on ativan protocol for benzodiazepine withdrawal coverage and is tolerating this well.   R Safety is in place and poc cont.

## 2014-12-11 NOTE — Progress Notes (Signed)
[facility] MD Progress Note  12/11/2014 11:49 AM [name]  MRN:  [id number] Subjective:   Patient states he is feeling better today. He denies alcohol WDL symptoms, except possibly for vague feeling of apprehension. He denies medication side effects. As noted, states Wellbutrin XL had been well tolerated during prior treatment. He denies any suicidal ideations. He denies cravings for alcohol today. Objective : I have reviewed chart notes and have met with patient. On unit patient has remained calm, cooperative, and has been visible in day room. States he has been on the phone with family, daughters, and feels he has a lot of support. He spoke at some length about  His alcohol consumption pattern- he has been drinking more than before, and admits he feels he has had episodes where " I drink much more than I should or intended to". He denies daily drinking, denies AM drinking, and does not identify any prior concrete alcohol related loss or stressor. He does state his daughter has expressed concern about his drinking. Denies cravings for ETOH at this time. He is tolerating medications well at present and is optimistic they will continue to help.  He is not presenting with tremors, diaphoresis, or psychomotor restlessness. No facial flushing. He does have elevated BP and tachycardia.  Principal Problem: MDD (major depressive disorder), recurrent severe, without psychosis Diagnosis:   Patient Active Problem List   Diagnosis Date Noted  . MDD (major depressive disorder), recurrent severe, without psychosis [F33.2] 12/10/2014  . Overdose of benzodiazepine [T42.4X1A] 12/10/2014  . Grief [F43.21] 03/15/2014  . Well adult exam [Z00.00] 09/18/2012  . DEPRESSIVE DISORDER [F32.9] 09/07/2009  . TOBACCO USE, QUIT [Z87.891] 07/04/2009  . INSOMNIA, PERSISTENT [G47.00] 02/20/2008  . ABDOMINAL PAIN [R10.9] 02/20/2008  . 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 ANXIETY [F41.1]  11/06/2007  . ALLERGIC RHINITIS [J30.9] 11/06/2007  . GERD [K21.9] 11/06/2007  . PANCREATITIS, HX OF [Z87.19] 11/06/2007   Total Time spent with patient: 20 minutes   Past Medical History:  Past Medical History  Diagnosis Date  . Anxiety   . GERD (gastroesophageal reflux disease)   . Allergic rhinitis   . Pancreatitis 2008    HX of  x2 ? cause   History reviewed. No pertinent past surgical history. Family History:  Family History  Problem Relation Age of Onset  . Anxiety disorder Other   . Heart disease Neg Hx   . Hypertension Mother   . Hypertension Father   . Hyperlipidemia Father    Social History:  History  Alcohol Use No     History  Drug Use No    History   Social History  . Marital Status: Divorced    Spouse Name: N/A  . Number of Children: 2  . Years of Education: N/A   Occupational History  . STYLIST    Social History Main Topics  . Smoking status: Former Smoker -- 0.25 packs/day for 27 years    Types: Cigarettes  . Smokeless tobacco: Not on file  . Alcohol Use: No  . Drug Use: No  . Sexual Activity: Yes    Birth Control/ Protection: Condom     Comment: permanent partner   Other Topics Concern  . None   Social History Narrative   Domestic partner Legrand Como   Regular exercise-yes   Additional History:    Sleep: Good  Appetite:  Good   Assessment:   Musculoskeletal: Strength & Muscle Tone: within normal limits- no significant tremors or diaphoresis  Gait & Station: normal Patient leans: N/A   Psychiatric Specialty Exam: Physical Exam  ROS- denies nausea,denies vomiting , denies headache  Blood pressure 154/98, pulse 108, temperature 97.7 F (36.5 C), temperature source Oral, resp. rate 20, height 5' 8.25" (1.734 m), weight 164 lb (74.39 kg).Body mass index is 24.74 kg/(m^2).  General Appearance: Well Groomed  Engineer, water::  Good  Speech:  Normal Rate  Volume:  Normal  Mood:  slightly depressed, but reports overall mood improved   Affect:  Appropriate and reactive   Thought Process:  Linear  Orientation:  Full (Time, Place, and Person)  Thought Content:  no hallucinations, no delusions  Suicidal Thoughts:  No- currently denies any thoughts of hurting self or anyone else   Homicidal Thoughts:  No  Memory:  recent and remote grossly intact  Judgement:  Good  Insight:  Present  Psychomotor Activity:  Normal  Concentration:  Good  Recall:  Good  Fund of Knowledge:Good  Language: Good  Akathisia:  Negative  Handed:  Right  AIMS (if indicated):     Assets:  Communication Skills Desire for Improvement Financial Resources/Insurance Resilience  ADL's: improved   Cognition: WNL  Sleep:  Number of Hours: 6.75     Current Medications: Current Facility-Administered Medications  Medication Dose Route Frequency Provider Last Rate Last Dose  . acetaminophen (TYLENOL) tablet 650 mg  650 mg Oral Q6H PRN Harriet Butte, NP   650 mg at 12/10/14 1638  . alum & mag hydroxide-simeth (MAALOX/MYLANTA) 200-200-20 MG/5ML suspension 30 mL  30 mL Oral Q4H PRN Harriet Butte, NP      . buPROPion (WELLBUTRIN XL) 24 hr tablet 150 mg  150 mg Oral Daily Myer Peer Cobos, MD   150 mg at 12/11/14 0830  . busPIRone (BUSPAR) tablet 15 mg  15 mg Oral BID Harriet Butte, NP   15 mg at 12/11/14 0828  . feeding supplement (ENSURE ENLIVE) (ENSURE ENLIVE) liquid 237 mL  237 mL Oral BID BM Myer Peer Cobos, MD   237 mL at 12/10/14 1457  . hydrOXYzine (ATARAX/VISTARIL) tablet 25 mg  25 mg Oral TID PRN Harriet Butte, NP   25 mg at 12/10/14 2154  . LORazepam (ATIVAN) tablet 1 mg  1 mg Oral Q4H PRN Jenne Campus, MD   1 mg at 12/11/14 0174  . magnesium hydroxide (MILK OF MAGNESIA) suspension 30 mL  30 mL Oral Daily PRN Harriet Butte, NP      . nicotine (NICODERM CQ - dosed in mg/24 hr) patch 7 mg  7 mg Transdermal Daily Myer Peer Cobos, MD   7 mg at 12/11/14 0830  . traZODone (DESYREL) tablet 50 mg  50 mg Oral QHS PRN Harriet Butte, NP    50 mg at 12/10/14 2154    Lab Results:  Results for orders placed or performed during the hospital encounter of 12/09/14 (from the past 48 hour(s))  Urine rapid drug screen (hosp performed)not at [facility]     Status: Abnormal   Collection Time: 12/09/14  4:29 PM  Result Value Ref Range   Opiates NONE DETECTED NONE DETECTED   Cocaine NONE DETECTED NONE DETECTED   Benzodiazepines POSITIVE (A) NONE DETECTED   Amphetamines NONE DETECTED NONE DETECTED   Tetrahydrocannabinol NONE DETECTED NONE DETECTED   Barbiturates NONE DETECTED NONE DETECTED    Comment:        DRUG SCREEN FOR MEDICAL PURPOSES ONLY.  IF CONFIRMATION IS NEEDED FOR ANY PURPOSE, NOTIFY  LAB WITHIN 5 DAYS.        LOWEST DETECTABLE LIMITS FOR URINE DRUG SCREEN Drug Class       Cutoff (ng/mL) Amphetamine      1000 Barbiturate      200 Benzodiazepine   466 Tricyclics       599 Opiates          300 Cocaine          300 THC              50   Urinalysis, Routine w reflex microscopic (not at [facility])     Status: Abnormal   Collection Time: 12/09/14  4:29 PM  Result Value Ref Range   Color, Urine YELLOW YELLOW   APPearance CLEAR CLEAR   Specific Gravity, Urine 1.014 1.005 - 1.030   pH 5.5 5.0 - 8.0   Glucose, UA NEGATIVE NEGATIVE mg/dL   Hgb urine dipstick SMALL (A) NEGATIVE   Bilirubin Urine NEGATIVE NEGATIVE   Ketones, ur NEGATIVE NEGATIVE mg/dL   Protein, ur 30 (A) NEGATIVE mg/dL   Urobilinogen, UA 0.2 0.0 - 1.0 mg/dL   Nitrite NEGATIVE NEGATIVE   Leukocytes, UA NEGATIVE NEGATIVE  Urine microscopic-add on     Status: None   Collection Time: 12/09/14  4:29 PM  Result Value Ref Range   Squamous Epithelial / LPF RARE RARE   RBC / HPF 0-2 <3 RBC/hpf   Urine-Other MUCOUS PRESENT   Acetaminophen level     Status: Abnormal   Collection Time: 12/09/14  4:55 PM  Result Value Ref Range   Acetaminophen (Tylenol), Serum <10 (L) 10 - 30 ug/mL    Comment:        THERAPEUTIC CONCENTRATIONS VARY SIGNIFICANTLY. A RANGE OF  10-30 ug/mL MAY BE AN EFFECTIVE CONCENTRATION FOR MANY PATIENTS. HOWEVER, SOME ARE BEST TREATED AT CONCENTRATIONS OUTSIDE THIS RANGE. ACETAMINOPHEN CONCENTRATIONS >150 ug/mL AT 4 HOURS AFTER INGESTION AND >50 ug/mL AT 12 HOURS AFTER INGESTION ARE OFTEN ASSOCIATED WITH TOXIC REACTIONS.   Comprehensive metabolic panel     Status: Abnormal   Collection Time: 12/09/14  4:55 PM  Result Value Ref Range   Sodium 139 135 - 145 mmol/L   Potassium 4.2 3.5 - 5.1 mmol/L   Chloride 105 101 - 111 mmol/L   CO2 23 22 - 32 mmol/L   Glucose, Bld 157 (H) 65 - 99 mg/dL   BUN 19 6 - 20 mg/dL   Creatinine, Ser 0.81 0.61 - 1.24 mg/dL   Calcium 9.0 8.9 - 10.3 mg/dL   Total Protein 7.8 6.5 - 8.1 g/dL   Albumin 4.3 3.5 - 5.0 g/dL   AST 72 (H) 15 - 41 U/L   ALT 49 17 - 63 U/L   Alkaline Phosphatase 131 (H) 38 - 126 U/L   Total Bilirubin 0.8 0.3 - 1.2 mg/dL   GFR calc non Af Amer >60 >60 mL/min   GFR calc Af Amer >60 >60 mL/min    Comment: (NOTE) The eGFR has been calculated using the CKD EPI equation. This calculation has not been validated in all clinical situations. eGFR's persistently <60 mL/min signify possible Chronic Kidney Disease.    Anion gap 11 5 - 15  CBC WITH DIFFERENTIAL     Status: Abnormal   Collection Time: 12/09/14  4:55 PM  Result Value Ref Range   WBC 10.5 4.0 - 10.5 K/uL    Comment: WHITE COUNT CONFIRMED ON SMEAR   RBC 5.22 4.22 - 5.81 MIL/uL   Hemoglobin  15.8 13.0 - 17.0 g/dL   HCT 47.4 39.0 - 52.0 %   MCV 90.8 78.0 - 100.0 fL   MCH 30.3 26.0 - 34.0 pg   MCHC 33.3 30.0 - 36.0 g/dL   RDW 13.0 11.5 - 15.5 %   Platelets  150 - 400 K/uL    PLATELET CLUMPS NOTED ON SMEAR, COUNT APPEARS ADEQUATE    Comment: SPECIMEN CHECKED FOR CLOTS PLATELET COUNT CONFIRMED BY SMEAR REPEATED TO VERIFY    Neutrophils Relative % 79 (H) 43 - 77 %   Lymphocytes Relative 14 12 - 46 %   Monocytes Relative 6 3 - 12 %   Eosinophils Relative 1 0 - 5 %   Basophils Relative 0 0 - 1 %   Neutro  Abs 8.3 (H) 1.7 - 7.7 K/uL   Lymphs Abs 1.5 0.7 - 4.0 K/uL   Monocytes Absolute 0.6 0.1 - 1.0 K/uL   Eosinophils Absolute 0.1 0.0 - 0.7 K/uL   Basophils Absolute 0.0 0.0 - 0.1 K/uL   Smear Review MORPHOLOGY UNREMARKABLE   Ethanol     Status: Abnormal   Collection Time: 12/09/14  4:55 PM  Result Value Ref Range   Alcohol, Ethyl (B) 7 (H) <5 mg/dL    Comment:        LOWEST DETECTABLE LIMIT FOR SERUM ALCOHOL IS 5 mg/dL FOR MEDICAL PURPOSES ONLY   Salicylate level     Status: None   Collection Time: 12/09/14  4:55 PM  Result Value Ref Range   Salicylate Lvl <1.2 2.8 - 30.0 mg/dL    Physical Findings: AIMS: Facial and Oral Movements Muscles of Facial Expression: None, normal Lips and Perioral Area: None, normal Jaw: None, normal Tongue: None, normal,Extremity Movements Upper (arms, wrists, hands, fingers): None, normal Lower (legs, knees, ankles, toes): None, normal, Trunk Movements Neck, shoulders, hips: None, normal, Overall Severity Severity of abnormal movements (highest score from questions above): None, normal Incapacitation due to abnormal movements: None, normal Patient's awareness of abnormal movements (rate only patient's report): No Awareness, Dental Status Current problems with teeth and/or dentures?: No Does patient usually wear dentures?: No  CIWA:    COWS:      Assessment : Patient reports improvement and minimizes depression at this time. Presents calm, with no current significant symptoms of WDL or agitation/restlessness. Endorses history of Alcohol Use Disorder, which may have been progressing in frequency and amount of ETOH consumed over recent days. Not suicidal or homicidal or psychotic. Of note, although presenting stable and not appearing to be restless or in any acute distress, he does have elevated BP and tachycardia, possibly related to ETOH WDL.   Treatment Plan Summary: Daily contact with patient to assess and evaluate symptoms and progress in  treatment, Medication management, Plan continue inpatient treatment and continue medications as below  1, Due to elevated BP , pulse potentially related to ETOH WDL- will initiate standing Ativan taper to minimize risk of WDL. 2. Due to potential for Wellbutrin XL to contribute to elevated BP, will D/C medication at this time and reevaluate , consider other options. 3. Continue Buspar 15 mgrs BID for  Anxiety 4. Continue Trazodone 50 mgrs QHS for Insomnia as needed .   Medical Decision Making:  Established Problem, Stable/Improving (1), Review of Psycho-Social Stressors (1), Review or order clinical lab tests (1) and Review of New Medication or Change in Dosage (2)     COBOS, FERNANDO 12/11/2014, 11:49 AM

## 2014-12-11 NOTE — BHH Group Notes (Signed)
[facility] Group Notes:  (Clinical Social Work)  12/11/2014     1:15-2:15PM  Summary of Progress/Problems:   The main focus of today's process group was to discuss how the purpose of both healthy and unhealthy coping skills is to meet needs that all people have in life.  Each group member talked about one healthy way and one unhealthy way they try to get their needs met.  While talking about the unhealthy method, the group gave suggestions for replacement healthier techniques.  Motivational Interviewing and a whiteboard were utilized to explore in depth the perceived benefits and costs of unhealthy coping techniques, and a Decisional Balance Exercise worksheet was provided for patients to work on at a later time.  The patient stated his healthy coping skill is exercise, usually biking or walking on the treadmill. He stated his unhealthy coping technique tends to be using alcohol.  He was very quiet in group but did try to respond when called on directly.  He smiled throughout group.  Type of Therapy:  Group Therapy - Process   Participation Level:  Active  Participation Quality:  Appropriate  Affect:  Appropriate  Cognitive:  Alert  Insight:  Developing/Improving  Engagement in Therapy:  Developing/Improving  Modes of Intervention:  Education, Motivational Interviewing  Selmer Dominion, LCSW 12/11/2014, 4:26 PM

## 2014-12-11 NOTE — BHH Group Notes (Signed)
[employer] Notes:  (Nursing/MHT/Case Management/Adjunct)  Date:  12/11/2014  Time: 0900 AM  Type of Therapy:  Psychoeducational Skills  Participation Level:  Minimal  Participation Quality:  Appropriate and Attentive  Affect:  Flat  Cognitive:  Alert and Appropriate  Insight:  Limited  Engagement in Group:  Resistant  Modes of Intervention:  Support  Summary of Progress/Problems:  Luke White 12/11/2014, 9:36 AM

## 2014-12-11 NOTE — Progress Notes (Signed)
Writer has observed patient up in the dayroom interacting with select peers appropriately. He requested his hs medications so that he could go to bed early. He reports that he has had a good day and has learned to not stop his medications cold [place]. He is smiling and pleasant. He reports that he has plans to move into a town house once discharged and then will be meeting his daughters at [facility] the beginning of July. He denies si/hi/a/v hallucinations. Support and encouragement given, safety maintained on unit with 15 min checks.

## 2014-12-11 NOTE — Plan of Care (Signed)
Problem: Diagnosis: Increased Risk For Suicide Attempt Goal: LTG-Patient Will Report Improved Mood and Deny Suicidal LTG (by discharge) Patient will report improved mood and deny suicidal ideation.  Outcome: Progressing Pt stated he was feeling better and better, and denies SI at this time.

## 2014-12-12 DIAGNOSIS — I10 Essential (primary) hypertension: Secondary | ICD-10-CM

## 2014-12-12 MED ORDER — BUSPIRONE HCL 15 MG PO TABS: 15.0000 mg | ORAL_TABLET | Freq: Two times a day (BID) | ORAL | Status: DC

## 2014-12-12 MED ORDER — AMLODIPINE BESYLATE 10 MG PO TABS
10.0000 mg | ORAL_TABLET | Freq: Every day | ORAL | Status: DC
  Administered 2014-12-12: 10 mg via ORAL
  Filled 2014-12-12 (×4): qty 1

## 2014-12-12 MED ORDER — OMEPRAZOLE 40 MG PO CPDR: 40.0000 mg | DELAYED_RELEASE_CAPSULE | Freq: Every day | ORAL | Status: DC

## 2014-12-12 MED ORDER — ADULT MULTIVITAMIN W/MINERALS CH: 1.0000 | ORAL_TABLET | Freq: Every day | ORAL | Status: DC

## 2014-12-12 MED ORDER — TRAZODONE HCL 50 MG PO TABS
50.0000 mg | ORAL_TABLET | Freq: Every evening | ORAL | Status: DC | PRN
Start: 2014-12-12 — End: 2015-02-27

## 2014-12-12 MED ORDER — HYDROXYZINE HCL 25 MG PO TABS: 25.0000 mg | ORAL_TABLET | Freq: Three times a day (TID) | ORAL | Status: DC | PRN

## 2014-12-12 MED ORDER — AMLODIPINE BESYLATE 10 MG PO TABS: 10.0000 mg | ORAL_TABLET | Freq: Every day | ORAL | Status: DC

## 2014-12-12 MED ORDER — VITAMIN D3 25 MCG (1000 UNIT) PO TABS: 1000.0000 [IU] | ORAL_TABLET | Freq: Every day | ORAL | Status: AC

## 2014-12-12 MED ORDER — PAROXETINE HCL 20 MG PO TABS: 20.0000 mg | ORAL_TABLET | Freq: Every day | ORAL | Status: DC

## 2014-12-12 MED ORDER — PAROXETINE HCL 20 MG PO TABS
20.0000 mg | ORAL_TABLET | Freq: Every day | ORAL | Status: DC
  Administered 2014-12-12: 20 mg via ORAL
  Filled 2014-12-12 (×4): qty 1

## 2014-12-12 MED ORDER — LORAZEPAM 1 MG PO TABS: ORAL_TABLET | ORAL | Status: DC

## 2014-12-12 MED ORDER — NICOTINE 7 MG/24HR TD PT24: 7.0000 mg | MEDICATED_PATCH | Freq: Every day | TRANSDERMAL | Status: DC

## 2014-12-12 NOTE — Consult Note (Signed)
Patient Demographics  Luke White, is a 54 y.o. male   MRN: [id number]   DOB - Jan 10, 1961  Admit Date - 12/10/2014    Outpatient Primary MD for the patient is [name], MD  Consult requested in the Hospital by Jenne Campus, MD, On 12/12/2014    Reason for consult: uncontrolled blood pressure   With History of -  Past Medical History  Diagnosis Date  . Anxiety   . GERD (gastroesophageal reflux disease)   . Allergic rhinitis   . Pancreatitis 2008    HX of  x2 ? cause      History reviewed. No pertinent past surgical history.  in for   No chief complaint on file.    HPI  Luke White  is a 54 y.o. male, who was admitted to [facility] on 6/17 as he overdosed on prescribed temazepam, as well as endorsed heavy alcohol abuse, hospitalist services were consulted regarding uncontrolled with systolic blood pressure running on average high 140s to low 150s, patient denies any history of hypertension in the past, reports his been having annual follow-up with PCP, patient denies any headache, blurry vision, chest pain or shortness of breath, denies any symptoms of withdrawal, was set suicidal ideations or thoughts.    Review of Systems    In addition to the HPI above, No Fever-chills, No Headache, No changes with Vision or hearing, No problems swallowing food or Liquids, No Chest pain, Cough or Shortness of Breath, No Abdominal pain, No Nausea or Vommitting, Bowel movements are regular, No Blood in stool or Urine, No dysuria, No new skin rashes or bruises, No new joints pains-aches,  No new weakness, tingling, numbness in any extremity, No recent weight gain or loss, No polyuria, polydypsia or polyphagia, No significant Mental Stressors.  A full 10 point Review of Systems was done, except as stated above, all other Review of Systems were negative.   Social History History    Substance Use Topics  . Smoking status: Former Smoker -- 0.25 packs/day for 27 years    Types: Cigarettes  . Smokeless tobacco: Not on file  . Alcohol Use: No     Family History Family History  Problem Relation Age of Onset  . Anxiety disorder Other   . Heart disease Neg Hx   . Hypertension Mother   . Hypertension Father   . Hyperlipidemia Father      Prior to Admission medications   Medication Sig Start Date End Date Taking? Authorizing Provider  busPIRone (BUSPAR) 15 MG tablet Take 1 tablet (15 mg total) by mouth 2 (two) times daily. 03/15/14  Yes Aleksei Plotnikov V, MD  HYDROcodone-acetaminophen (NORCO/VICODIN) 5-325 MG per tablet Take 1 tablet by mouth 2 (two) times daily as needed for severe pain. Please fill on or after 10/22/13 04/26/14  Yes Aleksei Plotnikov V, MD  temazepam (RESTORIL) 30 MG capsule TAKE 1 CAPSULE BY MOUTH EVERY DAY AT BEDTIME AS NEEDED FOR SLEEP Patient taking differently: TAKE 1 CAPSULE BY MOUTH EVERY  DAY AT BEDTIME 04/13/14  Yes Biagio Borg, MD  temazepam (RESTORIL) 30 MG capsule TAKE ONE CAPSULE BY MOUTH EVERY NIGHT AT BEDTIME AS NEEDED FOR SLEEP 07/01/14  Yes Aleksei Plotnikov V, MD  ALPRAZolam Duanne Moron) 0.5 MG tablet TAKE 1/2 TO 1 TABLET BY MOUTH TWICE DAILY AS NEEDED 06/14/14   Janith Lima, MD  amLODipine (NORVASC) 10 MG tablet Take 1 tablet (10 mg total) by mouth daily. For elevated blood pressure 12/12/14   Niel Hummer, NP  buPROPion (WELLBUTRIN XL) 150 MG 24 hr tablet TAKE 1 TABLET BY MOUTH EVERY MORNING Patient not taking: Reported on 12/09/2014 03/15/14   Aleksei Plotnikov V, MD  busPIRone (BUSPAR) 15 MG tablet Take 1 tablet (15 mg total) by mouth 2 (two) times daily. 12/12/14   Niel Hummer, NP  cholecalciferol (VITAMIN D) 1000 UNITS tablet Take 1 tablet (1,000 Units total) by mouth daily. 12/12/14   Niel Hummer, NP  hydrOXYzine (ATARAX/VISTARIL) 25 MG tablet Take 1 tablet (25 mg total) by mouth 3 (three) times daily as needed for anxiety. 12/12/14    Niel Hummer, NP  LORazepam (ATIVAN) 1 MG tablet Take one tablet on 12/12/14 at 5 pm, then take one tablet 8 am on 12/13/14 and at 5 pm, take the last remaining tablet on 12/14/14 to finish alcohol detox protocol. 12/12/14   Niel Hummer, NP  Multiple Vitamin (MULTIVITAMIN WITH MINERALS) TABS tablet Take 1 tablet by mouth daily. 12/12/14   Niel Hummer, NP  nicotine (NICODERM CQ - DOSED IN MG/24 HR) 7 mg/24hr patch Place 1 patch (7 mg total) onto the skin daily. 12/12/14   Niel Hummer, NP  omeprazole (PRILOSEC) 40 MG capsule Take 1 capsule (40 mg total) by mouth daily. 12/12/14   Niel Hummer, NP  Pancrelipase, Lip-Prot-Amyl, (ZENPEP) 10000 UNITS CPEP Take 1 capsule (10,000 Units total) by mouth 3 (three) times daily before meals. Patient not taking: Reported on 12/09/2014 03/15/14   Aleksei Plotnikov V, MD  PARoxetine (PAXIL) 20 MG tablet Take 1 tablet (20 mg total) by mouth daily. For depression 12/12/14   Niel Hummer, NP  traZODone (DESYREL) 50 MG tablet Take 1 tablet (50 mg total) by mouth at bedtime as needed for sleep. 12/12/14   Niel Hummer, NP    Anti-infectives    None      Scheduled Meds: . amLODipine  10 mg Oral Daily  . busPIRone  15 mg Oral BID  . feeding supplement (ENSURE ENLIVE)  237 mL Oral BID BM  . LORazepam  1 mg Oral TID   Followed by  . [START ON 12/13/2014] LORazepam  1 mg Oral BID   Followed by  . [START ON 12/14/2014] LORazepam  1 mg Oral Daily  . multivitamin with minerals  1 tablet Oral Daily  . nicotine  7 mg Transdermal Daily  . PARoxetine  20 mg Oral Daily  . thiamine  100 mg Oral Daily   Continuous Infusions:  PRN Meds:.acetaminophen, alum & mag hydroxide-simeth, hydrOXYzine, loperamide, LORazepam, magnesium hydroxide, ondansetron, traZODone  No Known Allergies  Physical Exam  Vitals  Blood pressure 148/97, pulse 102, temperature 98 F (36.7 C), temperature source Oral, resp. rate 17, height 5' 8.25" (1.734 m), weight 74.39 kg (164 lb).   1.  General well-nourished male lying in bed in NAD,   2. Normal affect and insight, pleasant, Not Suicidal or Homicidal, Awake Alert, Oriented X 3.  3. No F.N deficits, ALL C.Nerves Intact,  Strength 5/5 all 4 extremities, Sensation intact all 4 extremities, Plantars down going.  4. Ears and Eyes appear Normal, minimal periorbital bruising in the right eye from fall ,Conjunctivae clear, PERRLA. Moist Oral Mucosa.  5. Supple Neck, No JVD, No cervical lymphadenopathy appriciated, No Carotid Bruits.  6. Symmetrical Chest wall movement, Good air movement bilaterally, CTAB.  7. RRR, No Gallops, Rubs or Murmurs, No Parasternal Heave.  8. Positive Bowel Sounds, Abdomen Soft, No tenderness, No organomegaly appriciated,No rebound -guarding or rigidity.  9.  No Cyanosis, Normal Skin Turgor, No Skin Rash or Bruise.  10. Good muscle tone,  joints appear normal , no effusions, Normal ROM. No tremors.  11. No Palpable Lymph Nodes in Neck or Axillae   Data Review  CBC  Recent Labs Lab 12/09/14 1655  WBC 10.5  HGB 15.8  HCT 47.4  PLT PLATELET CLUMPS NOTED ON SMEAR, COUNT APPEARS ADEQUATE  MCV 90.8  MCH 30.3  MCHC 33.3  RDW 13.0  LYMPHSABS 1.5  MONOABS 0.6  EOSABS 0.1  BASOSABS 0.0   ------------------------------------------------------------------------------------------------------------------  Chemistries   Recent Labs Lab 12/09/14 1655  NA 139  K 4.2  CL 105  CO2 23  GLUCOSE 157*  BUN 19  CREATININE 0.81  CALCIUM 9.0  AST 72*  ALT 49  ALKPHOS 131*  BILITOT 0.8   ------------------------------------------------------------------------------------------------------------------ estimated creatinine clearance is 102.9 mL/min (by C-G formula based on Cr of 0.81). ------------------------------------------------------------------------------------------------------------------ No results for input(s): TSH, T4TOTAL, T3FREE, THYROIDAB in the last 72 hours.  Invalid  input(s): FREET3   Coagulation profile No results for input(s): INR, PROTIME in the last 168 hours. ------------------------------------------------------------------------------------------------------------------- No results for input(s): DDIMER in the last 72 hours. -------------------------------------------------------------------------------------------------------------------  Cardiac Enzymes No results for input(s): CKMB, TROPONINI, MYOGLOBIN in the last 168 hours.  Invalid input(s): CK ------------------------------------------------------------------------------------------------------------------ Invalid input(s): POCBNP   ---------------------------------------------------------------------------------------------------------------  Urinalysis    Component Value Date/Time   COLORURINE YELLOW 12/09/2014 [place] 12/09/2014 1629   LABSPEC 1.014 12/09/2014 1629   PHURINE 5.5 12/09/2014 1629   GLUCOSEU NEGATIVE 12/09/2014 1629   GLUCOSEU NEGATIVE 03/15/2014 0924   HGBUR SMALL* 12/09/2014 1629   BILIRUBINUR NEGATIVE 12/09/2014 [place] 12/09/2014 1629   PROTEINUR 30* 12/09/2014 1629   UROBILINOGEN 0.2 12/09/2014 1629   NITRITE NEGATIVE 12/09/2014 1629   LEUKOCYTESUR NEGATIVE 12/09/2014 1629     Imaging results:   No results found.      Assessment & Plan  Principal Problem:   MDD (major depressive disorder), recurrent severe, without psychosis Active Problems:   Overdose of benzodiazepine   Hypertension - Denies history of high blood pressure, but blood pressure is consistently elevated during hospital stay, she does not appear to be in any alcohol withdrawal or anxiety or agitation contributing to it, will start on amlodipine 10 mg oral daily, he can be discharged on it , he instructed to follow with his PCP in 2-3 weeks regarding medication adjustment.  Major depressive disorder - Management as per primary psychiatric  team  Alcohol abuse - Patient has no evidence of withdrawals on physical exam, on CIWA protocol, be discharged on tapering dose Ativan.     AM Labs Ordered, also please review Full Orders  Family Communication: Plan discussed with patient      [facility], Rhylin Venters M.D on 12/12/2014 at 11:54 AM  Between 7am to 7pm - Pager - [phone]  After 7pm go to [email or web address] - password [email or web address]   Thank you for the consult, we will follow  the patient with you in the [facility]   Office  [phone]

## 2014-12-12 NOTE — Progress Notes (Signed)
  [facility] Adult Case Management Discharge Plan :  Will you be returning to the same living situation after discharge:  Yes,  back home At discharge, do you have transportation home?: Yes,  has a ride Do you have the ability to pay for your medications: Yes,  no issues reported  Release of information consent forms completed and in the chart;  Patient's signature needed at discharge.  Patient to Follow up at: Follow-up Information    Follow up with [name], MD. Schedule an appointment as soon as possible for a visit in 3 weeks.   Specialty:  Internal Medicine   Why:  Hypertension and Depression   Contact information:   [place] [address] [phone]       Schedule an appointment as soon as possible for a visit with Pervis Hocking, PhD.   Why:  Call for follow-up therapy appointment, please, as soon as possible.  If you wish assistance with this, please call your hospital social worker [place] at [phone].   Contact information:   [address] [place]  [address] [phone]      Patient denies SI/HI: Yes,  see doctor D/C note    Safety Planning and Suicide Prevention discussed: Yes,  as indicated in chart  Have you used any form of tobacco in the last 30 days? (Cigarettes, Smokeless Tobacco, Cigars, and/or Pipes): Yes  Has patient been referred to the Quitline?: Yes, faxed on 12/12/14.  Lysle Dingwall 12/12/2014, 1:05 PM

## 2014-12-12 NOTE — Discharge Summary (Signed)
Physician Discharge Summary Note  Patient:  Luke White is an 54 y.o., male MRN:  [id number] DOB:  11-30-1960 Patient phone:  [phone] (home)  Patient address:   [place] [address],  Total Time spent with patient: 30 minutes  Date of Admission:  12/10/2014 Date of Discharge: 12/12/14  Reason for Admission:  Depression with suicide attempt   Principal Problem: MDD (major depressive disorder), recurrent severe, without psychosis Discharge Diagnoses: Patient Active Problem List   Diagnosis Date Noted  . MDD (major depressive disorder), recurrent severe, without psychosis [F33.2] 12/10/2014  . Overdose of benzodiazepine [T42.4X1A] 12/10/2014  . Grief [F43.21] 03/15/2014  . Well adult exam [Z00.00] 09/18/2012  . DEPRESSIVE DISORDER [F32.9] 09/07/2009  . TOBACCO USE, QUIT [Z87.891] 07/04/2009  . INSOMNIA, PERSISTENT [G47.00] 02/20/2008  . ABDOMINAL PAIN [R10.9] 02/20/2008  . 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 ANXIETY [F41.1] 11/06/2007  . ALLERGIC RHINITIS [J30.9] 11/06/2007  . GERD [K21.9] 11/06/2007  . PANCREATITIS, HX OF [Z87.19] 11/06/2007    Musculoskeletal: Strength & Muscle Tone: within normal limits Gait & Station: normal Patient leans: N/A  Psychiatric Specialty Exam: Physical Exam  Review of Systems  Constitutional: Negative.   HENT: Negative.   Eyes: Negative.   Respiratory: Negative.   Cardiovascular: Negative.   Gastrointestinal: Negative.   Genitourinary: Negative.   Musculoskeletal: Negative.   Skin: Negative.   Neurological: Negative.   Endo/Heme/Allergies: Negative.   Psychiatric/Behavioral: Positive for depression (Stabilized with treatments ). Negative for suicidal ideas, hallucinations, memory loss and substance abuse. The patient is not nervous/anxious and does not have insomnia.     Blood pressure 148/97, pulse 102, temperature 98 F (36.7 C), temperature source Oral, resp. rate 17,  height 5' 8.25" (1.734 m), weight 74.39 kg (164 lb).Body mass index is 24.74 kg/(m^2).  See Physician SRA     Have you used any form of tobacco in the last 30 days? (Cigarettes, Smokeless Tobacco, Cigars, and/or Pipes): Yes  Has this patient used any form of tobacco in the last 30 days? (Cigarettes, Smokeless Tobacco, Cigars, and/or Pipes) Discussed benefits of smoking cessation , and patient is interested in nicotine patches to continue effort to stop smoking   Past Medical History:  Past Medical History  Diagnosis Date  . Anxiety   . GERD (gastroesophageal reflux disease)   . Allergic rhinitis   . Pancreatitis 2008    HX of  x2 ? cause   History reviewed. No pertinent past surgical history. Family History:  Family History  Problem Relation Age of Onset  . Anxiety disorder Other   . Heart disease Neg Hx   . Hypertension Mother   . Hypertension Father   . Hyperlipidemia Father    Social History:  History  Alcohol Use No     History  Drug Use No    History   Social History  . Marital Status: Divorced    Spouse Name: N/A  . Number of Children: 2  . Years of Education: N/A   Occupational History  . STYLIST    Social History Main Topics  . Smoking status: Former Smoker -- 0.25 packs/day for 27 years    Types: Cigarettes  . Smokeless tobacco: Not on file  . Alcohol Use: No  . Drug Use: No  . Sexual Activity: Yes    Birth Control/ Protection: Condom     Comment: permanent partner   Other Topics Concern  . None   Social History Narrative  Domestic partner Legrand Como   Regular exercise-yes   Risk to Self: Is patient at risk for suicide?: Yes Risk to Others:   Prior Inpatient Therapy:   Prior Outpatient Therapy:    Level of Care:  OP  Hospital Course:   Luke White is an 54 y.o. Male who was brought to ED by friend after intentional overdose on 25 sleeping pills. Patient reports today is the anniversary of the death of his husband from complications from  liver transplant. Patient reports he also recently lost his father. Patient reports for the last couple of months he has been thinking maybe suicide would be the answer. He reports this is not like him, and he has lost several friends to suicide. Patient reports about three weeks ago he flushed his welbutrin and xanax down the toilet, but last night found a bottle of sleeping pills and took it. Patient is oriented times four but displaying some trouble with recall of dates and chronological order. Patient reports he generally is very detail oriented and felt foggy on his medication and has continued to have some trouble with focus and recall.          Luke White was admitted to the adult 400 unit. He was evaluated and his symptoms were identified. Medication management was discussed and initiated. Patient was placed on an Ativan taper due to history of alcohol use. The patient was placed on Wellbutrin XL for depression but this was later discontinued because of elevated blood pressure. The patient was started on Paxil 20 mg daily to treat these symptoms. He was oriented to the unit and encouraged to participate in unit programming. Medical problems were identified and treated appropriately. Patient received Norvasc 10 mg daily for Hypertension. Home medication was restarted as needed.        The patient was evaluated each day by a clinical provider to ascertain the patient's response to treatment.  Improvement was noted by the patient's report of decreasing symptoms, improved sleep and appetite, affect, medication tolerance, behavior, and participation in unit programming.  He was asked each day to complete a self inventory noting mood, mental status, pain, new symptoms, anxiety and concerns. During follow up assessments with Dr. Parke Poisson the patient talked about his pattern of alcohol use. The patient admitted that he has been drinking more lately and has had episodes of heavy drinking. He denied any cravings  for alcohol while in the hospital.          He responded well to medication and being in a therapeutic and supportive environment. Positive and appropriate behavior was noted and the patient was motivated for recovery.  The patient worked closely with the treatment team and case manager to develop a discharge plan with appropriate goals. Coping skills, problem solving as well as relaxation therapies were also part of the unit programming.         By the day of discharge he was in much improved condition than upon admission.  Symptoms were reported as significantly decreased or resolved completely. The patient denied SI/HI and voiced no AVH. He was motivated to continue taking medication with a goal of continued improvement in mental health.   Luke White was discharged home with a plan to follow up as noted below. The patient was provided with three day sample medications and prescriptions at time of discharge. He left [facility] in stable condition with all belongings returned to him.   Consults:  Internal Medicine for elevated blood pressure  Significant Diagnostic Studies:  Chemistry panel, CBC, UDS  Discharge Vitals:   Blood pressure 148/97, pulse 102, temperature 98 F (36.7 C), temperature source Oral, resp. rate 17, height 5' 8.25" (1.734 m), weight 74.39 kg (164 lb). Body mass index is 24.74 kg/(m^2). Lab Results:   No results found for this or any previous visit (from the past 72 hour(s)).  Physical Findings: AIMS: Facial and Oral Movements Muscles of Facial Expression: None, normal Lips and Perioral Area: None, normal Jaw: None, normal Tongue: None, normal,Extremity Movements Upper (arms, wrists, hands, fingers): None, normal Lower (legs, knees, ankles, toes): None, normal, Trunk Movements Neck, shoulders, hips: None, normal, Overall Severity Severity of abnormal movements (highest score from questions above): None, normal Incapacitation due to abnormal movements: None,  normal Patient's awareness of abnormal movements (rate only patient's report): No Awareness, Dental Status Current problems with teeth and/or dentures?: No Does patient usually wear dentures?: No  CIWA:  CIWA-Ar Total: 3 COWS:      See Psychiatric Specialty Exam and Suicide Risk Assessment completed by Attending Physician prior to discharge.  Discharge destination:  Home  Is patient on multiple antipsychotic therapies at discharge:  No   Has Patient had three or more failed trials of antipsychotic monotherapy by history:  No  Recommended Plan for Multiple Antipsychotic Therapies: NA      Discharge Instructions    Discharge instructions    Complete by:  As directed   Please make an appointment as soon as possible with your Primary Care Provider for follow up of elevated blood pressure. You were started on Norvasc 10 mg daily during your admission and provided a prescription to last thirty days.            Medication List    STOP taking these medications        ALPRAZolam 0.5 MG tablet  Commonly known as:  XANAX     buPROPion 150 MG 24 hr tablet  Commonly known as:  WELLBUTRIN XL     HYDROcodone-acetaminophen 5-325 MG per tablet  Commonly known as:  NORCO/VICODIN     Pancrelipase (Lip-Prot-Amyl) 10000 UNITS Cpep  Commonly known as:  ZENPEP     temazepam 30 MG capsule  Commonly known as:  RESTORIL      TAKE these medications      Indication   amLODipine 10 MG tablet  Commonly known as:  NORVASC  Take 1 tablet (10 mg total) by mouth daily. For elevated blood pressure   Indication:  High Blood Pressure     busPIRone 15 MG tablet  Commonly known as:  BUSPAR  Take 1 tablet (15 mg total) by mouth 2 (two) times daily.   Indication:  Generalized Anxiety Disorder     cholecalciferol 1000 UNITS tablet  Commonly known as:  VITAMIN D  Take 1 tablet (1,000 Units total) by mouth daily.   Indication:  Vitamin D Supplementation     hydrOXYzine 25 MG tablet  Commonly  known as:  ATARAX/VISTARIL  Take 1 tablet (25 mg total) by mouth 3 (three) times daily as needed for anxiety.   Indication:  Anxiety Neurosis     LORazepam 1 MG tablet  Commonly known as:  ATIVAN  Take one tablet on 12/12/14 at 5 pm, then take one tablet 8 am on 12/13/14 and at 5 pm, take the last remaining tablet on 12/14/14 to finish alcohol detox protocol.   Indication:  Acute Alcohol Withdrawal Syndrome     multivitamin with minerals Tabs tablet  Take 1 tablet by mouth daily.   Indication:  Vitamin Supplementation     nicotine 7 mg/24hr patch  Commonly known as:  NICODERM CQ - dosed in mg/24 hr  Place 1 patch (7 mg total) onto the skin daily.   Indication:  Nicotine Addiction     omeprazole 40 MG capsule  Commonly known as:  PRILOSEC  Take 1 capsule (40 mg total) by mouth daily.   Indication:  Gastroesophageal Reflux Disease     PARoxetine 20 MG tablet  Commonly known as:  PAXIL  Take 1 tablet (20 mg total) by mouth daily. For depression   Indication:  Major Depressive Disorder     traZODone 50 MG tablet  Commonly known as:  DESYREL  Take 1 tablet (50 mg total) by mouth at bedtime as needed for sleep.   Indication:  Trouble Sleeping       Follow-up Information    Follow up with [name], MD. Schedule an appointment as soon as possible for a visit in 3 weeks.   Specialty:  Internal Medicine   Why:  Hypertension and Depression   Contact information:   [place] [address] [phone]       Schedule an appointment as soon as possible for a visit with Pervis Hocking, PhD.   Why:  Call for follow-up therapy appointment, please, as soon as possible.  If you wish assistance with this, please call your hospital social worker [place] at [phone].   Contact information:   [address] [place]  [address] [phone]     Follow-up recommendations:   Activity: as tolerated  Diet: heart healthy Tests: NA Other: see  below  Comments:   Take all your medications as prescribed by your mental healthcare provider.  Report any adverse effects and or reactions from your medicines to your outpatient provider promptly.  Patient is instructed and cautioned to not engage in alcohol and or illegal drug use while on prescription medicines.  In the event of worsening symptoms, patient is instructed to call the crisis hotline, 911 and or go to the nearest ED for appropriate evaluation and treatment of symptoms.  Follow-up with your primary care provider for your other medical issues, concerns and or health care needs.   Total Discharge Time: Greater than 30 minutes  Signed: DAVIS, LAURA NP-C 12/13/2014, 2:42 PM   Patient seen, Suicide Assessment Completed.  Disposition Plan Reviewed

## 2014-12-12 NOTE — Progress Notes (Signed)
D) Pt beng discharged to home via a cab. Affect and mood are appropriate. Pt denies SI and HI. States "i really didn't want to be here but I am glad that I came. I have learned so much about me and I am grateful to everyone for their help". A) All belongings returned to Pt. Pt given support and reassurance along with praise and encouragement. Provided with a 1:1. All medications explained to Pt. All belongings returned to Pt. Follow up plans discussed with Pt and Pt is fully aware. DR) Pt denies SI and HI.[name]

## 2014-12-12 NOTE — Plan of Care (Signed)
Problem: Diagnosis: Increased Risk For Suicide Attempt Goal: LTG-Patient Will Report Improved Mood and Deny Suicidal LTG (by discharge) Patient will report improved mood and deny suicidal ideation.  Outcome: Progressing Patient currently denies suicidal ideations and reports that he feels better.

## 2014-12-12 NOTE — [facility] Suicide Risk Assessment (Addendum)
[facility] Discharge Suicide Risk Assessment   Demographic Factors:  54 year old male, widowed, lives alone, employed, two adult children  Total Time spent with patient: 30 minutes  Musculoskeletal: Strength & Muscle Tone: within normal limits Gait & Station: normal Patient leans: N/A  Psychiatric Specialty Exam: Physical Exam  ROS  Blood pressure 148/97, pulse 102, temperature 98 F (36.7 C), temperature source Oral, resp. rate 17, height 5' 8.25" (1.734 m), weight 164 lb (74.39 kg).Body mass index is 24.74 kg/(m^2).  General Appearance: Well Groomed  Eye Contact::  Good  Speech:  Normal Rate409  Volume:  Normal  Mood:  improved mood , denies depression at this time   Affect:  Appropriate  Thought Process:  Goal Directed and Linear  Orientation:  Full (Time, Place, and Person)  Thought Content:  denies any hallucinations , no delusions   Suicidal Thoughts:  No  Homicidal Thoughts:  No  Memory:  recent and remote grossly intact   Judgement:  Good  Insight:  Present  Psychomotor Activity:  Normal  Concentration:  Good  Recall:  Good  Fund of Knowledge:Good  Language: Good  Akathisia:  Negative  Handed:  Right  AIMS (if indicated):     Assets:  Communication Skills Desire for Improvement Housing Resilience Social Support  Sleep:  Number of Hours: 6.75  Cognition: WNL  ADL's:  Intact   Have you used any form of tobacco in the last 30 days? (Cigarettes, Smokeless Tobacco, Cigars, and/or Pipes): Yes  Has this patient used any form of tobacco in the last 30 days? (Cigarettes, Smokeless Tobacco, Cigars, and/or Pipes)  We discussed benefits of smoking cessation , and patient is interested in nicotine patches to continue effort to stop smoking   Mental Status Per Nursing Assessment::   On Admission:     Current Mental Status by Physician: At this time patient is improved, mood is euthymic, affect is appropriate, no thought disorder, no suicidal ideations, no homicidal  ideations, no delusions, and no hallucinations, future oriented, planning on returning to work soon, and working on moving into a new apartment in the near future .  Loss Factors: Death of spouse in 05-Jan-2014, loss of father 3/16  Historical Factors: Depression, Alcohol Abuse   Risk Reduction Factors:   Sense of responsibility to family, Employed, Positive social support and Positive coping skills or problem solving skills  Continued Clinical Symptoms:  At this time patient much improved . Currently does not appear to be in any alcohol WDL- no tremors, no diaphoresis, calm. BP still elevated, and it is felt patient may have underlying HTN. Due to this , Wellbutrin XL was stopped ( due to potential for this antidepressant to cause elevations in BP) , we discussed other options- he has been on Paxil in the past, with no side effects. Will start Paxil at 20 mgrs a day.  I have consulted hospitalist- patient to be started on Amlodipine for management of elevated BP.   Cognitive Features That Contribute To Risk:  No gross cognitive deficits noted upon discharge. Is alert , attentive, and oriented x 3   Suicide Risk:  Mild:  Suicidal ideation of limited frequency, intensity, duration, and specificity.  There are no identifiable plans, no associated intent, mild dysphoria and related symptoms, good self-control (both objective and subjective assessment), few other risk factors, and identifiable protective factors, including available and accessible social support.  Principal Problem: MDD (major depressive disorder), recurrent severe, without psychosis Discharge Diagnoses:  Patient Active Problem  List   Diagnosis Date Noted  . MDD (major depressive disorder), recurrent severe, without psychosis [F33.2] 12/10/2014  . Overdose of benzodiazepine [T42.4X1A] 12/10/2014  . Grief [F43.21] 03/15/2014  . Well adult exam [Z00.00] 09/18/2012  . DEPRESSIVE DISORDER [F32.9] 09/07/2009  . TOBACCO USE, QUIT  [Z87.891] 07/04/2009  . INSOMNIA, PERSISTENT [G47.00] 02/20/2008  . ABDOMINAL PAIN [R10.9] 02/20/2008  . 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 ANXIETY [F41.1] 11/06/2007  . ALLERGIC RHINITIS [J30.9] 11/06/2007  . GERD [K21.9] 11/06/2007  . PANCREATITIS, HX OF [Z87.19] 11/06/2007      Plan Of Care/Follow-up recommendations:  Activity:  as tolerated  Diet:  heart healthy Tests:  NA Other:  see below  Is patient on multiple antipsychotic therapies at discharge:  No   Has Patient had three or more failed trials of antipsychotic monotherapy by history:  No  Recommended Plan for Multiple Antipsychotic Therapies: NA Patient is requesting discharge, has put in a 72 hour letter requesting discharge,  and at this time there are no grounds for involuntary commitment . Patient is leaving in good spirits. Plans to return home. Follow up -  Has an established therapist, Dr. Pervis Hocking, for individual psychotherapy, has appt  Next Wednesday, states PCP will continue prescribing his antidepressant . Has an established PCP, Dr. [name], at [facility] . Encouraged patient to abstain from alcohol, and consider going to [place], Iyonnah Ferrante 12/12/2014, 10:43 AM

## 2014-12-16 ENCOUNTER — Telehealth: Payer: Self-pay | Admitting: Internal Medicine

## 2014-12-16 NOTE — Telephone Encounter (Signed)
Received records from [facility] forwarded to Dr. [name] 12/16/14 fbg.

## 2015-02-27 ENCOUNTER — Inpatient Hospital Stay (HOSPITAL_COMMUNITY)
Admission: EM | Admit: 2015-02-27 | Discharge: 2015-03-02 | DRG: 440 | Disposition: A | Discharge status: Home / Self Care | Payer: BLUE CROSS/BLUE SHIELD | Attending: Internal Medicine | Admitting: Internal Medicine

## 2015-02-27 ENCOUNTER — Encounter (HOSPITAL_COMMUNITY): Payer: Self-pay

## 2015-02-27 ENCOUNTER — Inpatient Hospital Stay (HOSPITAL_COMMUNITY): Payer: BLUE CROSS/BLUE SHIELD

## 2015-02-27 DIAGNOSIS — F1010 Alcohol abuse, uncomplicated: Secondary | ICD-10-CM | POA: Diagnosis present

## 2015-02-27 DIAGNOSIS — Z79899 Other long term (current) drug therapy: Secondary | ICD-10-CM

## 2015-02-27 DIAGNOSIS — R251 Tremor, unspecified: Secondary | ICD-10-CM | POA: Diagnosis present

## 2015-02-27 DIAGNOSIS — D696 Thrombocytopenia, unspecified: Secondary | ICD-10-CM | POA: Diagnosis present

## 2015-02-27 DIAGNOSIS — R1013 Epigastric pain: Secondary | ICD-10-CM | POA: Diagnosis not present

## 2015-02-27 DIAGNOSIS — E876 Hypokalemia: Secondary | ICD-10-CM | POA: Diagnosis present

## 2015-02-27 DIAGNOSIS — R1011 Right upper quadrant pain: Secondary | ICD-10-CM

## 2015-02-27 DIAGNOSIS — F419 Anxiety disorder, unspecified: Secondary | ICD-10-CM | POA: Diagnosis present

## 2015-02-27 DIAGNOSIS — K760 Fatty (change of) liver, not elsewhere classified: Secondary | ICD-10-CM | POA: Diagnosis present

## 2015-02-27 DIAGNOSIS — I10 Essential (primary) hypertension: Secondary | ICD-10-CM | POA: Diagnosis present

## 2015-02-27 DIAGNOSIS — F329 Major depressive disorder, single episode, unspecified: Secondary | ICD-10-CM | POA: Diagnosis present

## 2015-02-27 DIAGNOSIS — K860 Alcohol-induced chronic pancreatitis: Secondary | ICD-10-CM | POA: Diagnosis not present

## 2015-02-27 DIAGNOSIS — K859 Acute pancreatitis without necrosis or infection, unspecified: Secondary | ICD-10-CM | POA: Diagnosis present

## 2015-02-27 DIAGNOSIS — F17210 Nicotine dependence, cigarettes, uncomplicated: Secondary | ICD-10-CM | POA: Diagnosis present

## 2015-02-27 DIAGNOSIS — E1165 Type 2 diabetes mellitus with hyperglycemia: Secondary | ICD-10-CM | POA: Diagnosis present

## 2015-02-27 DIAGNOSIS — R748 Abnormal levels of other serum enzymes: Secondary | ICD-10-CM | POA: Diagnosis present

## 2015-02-27 DIAGNOSIS — K852 Alcohol induced acute pancreatitis: Secondary | ICD-10-CM | POA: Diagnosis not present

## 2015-02-27 DIAGNOSIS — Z791 Long term (current) use of non-steroidal anti-inflammatories (NSAID): Secondary | ICD-10-CM | POA: Diagnosis not present

## 2015-02-27 DIAGNOSIS — R1012 Left upper quadrant pain: Secondary | ICD-10-CM

## 2015-02-27 DIAGNOSIS — Z8249 Family history of ischemic heart disease and other diseases of the circulatory system: Secondary | ICD-10-CM

## 2015-02-27 DIAGNOSIS — K219 Gastro-esophageal reflux disease without esophagitis: Secondary | ICD-10-CM | POA: Diagnosis present

## 2015-02-27 DIAGNOSIS — K861 Other chronic pancreatitis: Secondary | ICD-10-CM | POA: Diagnosis present

## 2015-02-27 DIAGNOSIS — F32A Depression, unspecified: Secondary | ICD-10-CM

## 2015-02-27 DIAGNOSIS — F17200 Nicotine dependence, unspecified, uncomplicated: Secondary | ICD-10-CM

## 2015-02-27 DIAGNOSIS — E119 Type 2 diabetes mellitus without complications: Secondary | ICD-10-CM

## 2015-02-27 DIAGNOSIS — F418 Other specified anxiety disorders: Secondary | ICD-10-CM | POA: Diagnosis not present

## 2015-02-27 LAB — COMPREHENSIVE METABOLIC PANEL
ALBUMIN: 3.9 g/dL (ref 3.5–5.0)
ALK PHOS: 121 U/L (ref 38–126)
ALT: 37 U/L (ref 17–63)
ANION GAP: 9 (ref 5–15)
AST: 40 U/L (ref 15–41)
BILIRUBIN TOTAL: 0.9 mg/dL (ref 0.3–1.2)
BUN: 6 mg/dL (ref 6–20)
CALCIUM: 9 mg/dL (ref 8.9–10.3)
CO2: 25 mmol/L (ref 22–32)
Chloride: 99 mmol/L — ABNORMAL LOW (ref 101–111)
Creatinine, Ser: 0.69 mg/dL (ref 0.61–1.24)
GFR calc Af Amer: >60 mL/min (ref >60)
GFR calc non Af Amer: >60 mL/min (ref >60)
GLUCOSE: 262 mg/dL — AB (ref 65–99)
POTASSIUM: 3.5 mmol/L (ref 3.5–5.1)
SODIUM: 133 mmol/L — AB (ref 135–145)
Total Protein: 7.4 g/dL (ref 6.5–8.1)

## 2015-02-27 LAB — CBC WITH DIFFERENTIAL/PLATELET
BASOS ABS: 0 10*3/uL (ref 0.0–0.1)
BASOS PCT: 0 % (ref 0–1)
Eosinophils Absolute: 0.1 10*3/uL (ref 0.0–0.7)
Eosinophils Relative: 1 % (ref 0–5)
HEMATOCRIT: 41.8 % (ref 39.0–52.0)
HEMOGLOBIN: 14.6 g/dL (ref 13.0–17.0)
LYMPHS PCT: 11 % — AB (ref 12–46)
Lymphs Abs: 1.2 10*3/uL (ref 0.7–4.0)
MCH: 31.3 pg (ref 26.0–34.0)
MCHC: 34.9 g/dL (ref 30.0–36.0)
MCV: 89.7 fL (ref 78.0–100.0)
MONO ABS: 0.7 10*3/uL (ref 0.1–1.0)
Monocytes Relative: 7 % (ref 3–12)
NEUTROS ABS: 8.5 10*3/uL — AB (ref 1.7–7.7)
NEUTROS PCT: 82 % — AB (ref 43–77)
Platelets: 46 10*3/uL — ABNORMAL LOW (ref 150–400)
RBC: 4.66 MIL/uL (ref 4.22–5.81)
RDW: 13.1 % (ref 11.5–15.5)
Smear Review: DECREASED
WBC: 10.4 10*3/uL (ref 4.0–10.5)

## 2015-02-27 LAB — URINALYSIS, ROUTINE W REFLEX MICROSCOPIC
Bilirubin Urine: NEGATIVE
Glucose, UA: 500 mg/dL — AB
Hgb urine dipstick: NEGATIVE
Ketones, ur: 40 mg/dL — AB
Leukocytes, UA: NEGATIVE
Nitrite: NEGATIVE
PH: 7 (ref 5.0–8.0)
Protein, ur: 30 mg/dL — AB
SPECIFIC GRAVITY, URINE: 1.012 (ref 1.005–1.030)
UROBILINOGEN UA: 1 mg/dL (ref 0.0–1.0)

## 2015-02-27 LAB — RAPID URINE DRUG SCREEN, HOSP PERFORMED
AMPHETAMINES: NOT DETECTED
BARBITURATES: NOT DETECTED
Benzodiazepines: NOT DETECTED
COCAINE: NOT DETECTED
OPIATES: NOT DETECTED
TETRAHYDROCANNABINOL: NOT DETECTED

## 2015-02-27 LAB — LIPASE, BLOOD: Lipase: 196 U/L — ABNORMAL HIGH (ref 22–51)

## 2015-02-27 LAB — ETHANOL: Alcohol, Ethyl (B): <5 mg/dL (ref <5)

## 2015-02-27 LAB — URINE MICROSCOPIC-ADD ON

## 2015-02-27 LAB — GLUCOSE, CAPILLARY
Glucose-Capillary: 193 mg/dL — ABNORMAL HIGH (ref 65–99)
Glucose-Capillary: 104 mg/dL — ABNORMAL HIGH (ref 65–99)

## 2015-02-27 MED ORDER — ONDANSETRON HCL 4 MG/2ML IJ SOLN
4.0000 mg | Freq: Four times a day (QID) | INTRAMUSCULAR | Status: DC | PRN
  Administered 2015-02-27 – 2015-02-28 (×3): 4 mg via INTRAVENOUS
  Filled 2015-02-27 (×3): qty 2

## 2015-02-27 MED ORDER — ADULT MULTIVITAMIN W/MINERALS CH
1.0000 | ORAL_TABLET | Freq: Every day | ORAL | Status: DC
  Administered 2015-02-28 – 2015-03-02 (×3): 1 via ORAL
  Filled 2015-02-27 (×3): qty 1

## 2015-02-27 MED ORDER — BUSPIRONE HCL 15 MG PO TABS
15.0000 mg | ORAL_TABLET | Freq: Two times a day (BID) | ORAL | Status: DC
  Administered 2015-02-27 – 2015-03-02 (×6): 15 mg via ORAL
  Filled 2015-02-27 (×7): qty 1

## 2015-02-27 MED ORDER — THIAMINE HCL 100 MG/ML IJ SOLN: 100.0000 mg | Freq: Every day | INTRAMUSCULAR | Status: DC

## 2015-02-27 MED ORDER — HYDROMORPHONE HCL 1 MG/ML IJ SOLN
1.0000 mg | Freq: Once | INTRAMUSCULAR | Status: AC
Start: 2015-02-27 — End: 2015-02-27
  Administered 2015-02-27: 1 mg via INTRAVENOUS
  Filled 2015-02-27: qty 1

## 2015-02-27 MED ORDER — INFLUENZA VAC SPLIT QUAD 0.5 ML IM SUSY
0.5000 mL | PREFILLED_SYRINGE | INTRAMUSCULAR | Status: AC
  Administered 2015-02-28: 0.5 mL via INTRAMUSCULAR
  Filled 2015-02-27 (×2): qty 0.5

## 2015-02-27 MED ORDER — VITAMIN B-1 100 MG PO TABS
100.0000 mg | ORAL_TABLET | Freq: Every day | ORAL | Status: DC
  Administered 2015-02-28 – 2015-03-02 (×3): 100 mg via ORAL
  Filled 2015-02-27 (×3): qty 1

## 2015-02-27 MED ORDER — SODIUM CHLORIDE 0.9 % IV SOLN
INTRAVENOUS | Status: DC
  Administered 2015-02-27 – 2015-03-01 (×7): via INTRAVENOUS

## 2015-02-27 MED ORDER — INSULIN ASPART 100 UNIT/ML ~~LOC~~ SOLN
0.0000 [IU] | Freq: Three times a day (TID) | SUBCUTANEOUS | Status: DC
Start: 2015-02-27 — End: 2015-03-02
  Administered 2015-02-27: 2 [IU] via SUBCUTANEOUS
  Administered 2015-03-01: 1 [IU] via SUBCUTANEOUS
  Administered 2015-03-01: 3 [IU] via SUBCUTANEOUS
  Administered 2015-03-02: 2 [IU] via SUBCUTANEOUS

## 2015-02-27 MED ORDER — HYDROXYZINE HCL 25 MG PO TABS
25.0000 mg | ORAL_TABLET | Freq: Three times a day (TID) | ORAL | Status: DC | PRN
  Administered 2015-02-27 – 2015-03-02 (×4): 25 mg via ORAL
  Filled 2015-02-27 (×4): qty 1

## 2015-02-27 MED ORDER — FOLIC ACID 1 MG PO TABS
1.0000 mg | ORAL_TABLET | Freq: Every day | ORAL | Status: DC
  Administered 2015-02-28 – 2015-03-02 (×3): 1 mg via ORAL
  Filled 2015-02-27 (×3): qty 1

## 2015-02-27 MED ORDER — ONDANSETRON HCL 4 MG PO TABS
4.0000 mg | ORAL_TABLET | Freq: Four times a day (QID) | ORAL | Status: DC | PRN
  Administered 2015-03-02: 4 mg via ORAL
  Filled 2015-02-27: qty 1

## 2015-02-27 MED ORDER — HYDROMORPHONE HCL 1 MG/ML IJ SOLN
1.0000 mg | INTRAMUSCULAR | Status: DC | PRN
  Administered 2015-02-27 – 2015-03-02 (×12): 1 mg via INTRAVENOUS
  Filled 2015-02-27 (×13): qty 1

## 2015-02-27 MED ORDER — HYDROCODONE-ACETAMINOPHEN 5-325 MG PO TABS
1.0000 | ORAL_TABLET | ORAL | Status: DC | PRN
  Administered 2015-02-27 – 2015-03-02 (×11): 2 via ORAL
  Filled 2015-02-27 (×11): qty 2

## 2015-02-27 MED ORDER — AMLODIPINE BESYLATE 10 MG PO TABS: 10.0000 mg | ORAL_TABLET | Freq: Every day | ORAL | Status: DC

## 2015-02-27 MED ORDER — ONDANSETRON HCL 4 MG/2ML IJ SOLN
4.0000 mg | Freq: Once | INTRAMUSCULAR | Status: AC
  Administered 2015-02-27: 4 mg via INTRAVENOUS
  Filled 2015-02-27: qty 2

## 2015-02-27 MED ORDER — BUPROPION HCL ER (XL) 150 MG PO TB24
150.0000 mg | ORAL_TABLET | Freq: Every day | ORAL | Status: DC
  Administered 2015-02-28 – 2015-03-02 (×3): 150 mg via ORAL
  Filled 2015-02-27 (×3): qty 1

## 2015-02-27 MED ORDER — NICOTINE 7 MG/24HR TD PT24
7.0000 mg | MEDICATED_PATCH | Freq: Every day | TRANSDERMAL | Status: DC
  Administered 2015-02-27 – 2015-03-02 (×4): 7 mg via TRANSDERMAL
  Filled 2015-02-27 (×6): qty 1

## 2015-02-27 MED ORDER — LORAZEPAM 1 MG PO TABS
1.0000 mg | ORAL_TABLET | Freq: Four times a day (QID) | ORAL | Status: DC | PRN
  Administered 2015-03-01 – 2015-03-02 (×3): 1 mg via ORAL
  Filled 2015-02-27 (×3): qty 1

## 2015-02-27 MED ORDER — LORAZEPAM 2 MG/ML IJ SOLN
1.0000 mg | Freq: Four times a day (QID) | INTRAMUSCULAR | Status: DC | PRN
  Administered 2015-02-27 – 2015-03-01 (×5): 1 mg via INTRAVENOUS
  Filled 2015-02-27 (×6): qty 1

## 2015-02-27 NOTE — ED Provider Notes (Signed)
CSN: [id number]     Arrival date & time 02/27/15  1157 History   First MD Initiated Contact with Patient 02/27/15 1222     Chief Complaint  Patient presents with  . Abdominal Pain     (Consider location/radiation/quality/duration/timing/severity/associated sxs/prior Treatment) Patient is a 54 y.o. male presenting with abdominal pain.  Abdominal Pain Pain location:  Epigastric Pain quality: sharp   Pain radiates to:  Back Pain severity:  Moderate Onset quality:  Gradual Duration:  4 days Timing:  Constant Progression:  Unchanged Chronicity:  Recurrent Context comment:  Prior idiopathic pancreatitis Relieved by:  Nothing Worsened by:  Movement, palpation and eating Ineffective treatments:  None tried Associated symptoms: no anorexia, no cough, no fever, no hematuria, no nausea and no vomiting     Past Medical History  Diagnosis Date  . Anxiety   . GERD (gastroesophageal reflux disease)   . Allergic rhinitis   . Pancreatitis 2008    HX of  x2 ? cause   No past surgical history on file. Family History  Problem Relation Age of Onset  . Anxiety disorder Other   . Heart disease Neg Hx   . Hypertension Mother   . Hypertension Father   . Hyperlipidemia Father    Social History  Substance Use Topics  . Smoking status: Former Smoker -- 0.25 packs/day for 27 years    Types: Cigarettes  . Smokeless tobacco: None  . Alcohol Use: No    Review of Systems  Constitutional: Negative for fever.  Respiratory: Negative for cough.   Gastrointestinal: Positive for abdominal pain. Negative for nausea, vomiting and anorexia.  Genitourinary: Negative for hematuria.  All other systems reviewed and are negative.     Allergies  Review of patient's allergies indicates no known allergies.  Home Medications   Prior to Admission medications   Medication Sig Start Date End Date Taking? Authorizing Provider  acetaminophen (TYLENOL) 500 MG tablet Take 1,000 mg by mouth every 6 (six)  hours as needed for moderate pain.   Yes Historical Provider, MD  buPROPion (WELLBUTRIN XL) 150 MG 24 hr tablet Take 150 mg by mouth daily.   Yes Historical Provider, MD  busPIRone (BUSPAR) 15 MG tablet Take 1 tablet (15 mg total) by mouth 2 (two) times daily. 12/12/14  Yes Niel Hummer, NP  cholecalciferol (VITAMIN D) 1000 UNITS tablet Take 1 tablet (1,000 Units total) by mouth daily. 12/12/14  Yes Niel Hummer, NP  ibuprofen (ADVIL,MOTRIN) 200 MG tablet Take 400 mg by mouth every 6 (six) hours as needed for moderate pain.   Yes Historical Provider, MD  omeprazole (PRILOSEC) 40 MG capsule Take 1 capsule (40 mg total) by mouth daily. 12/12/14  Yes Niel Hummer, NP  amLODipine (NORVASC) 10 MG tablet Take 1 tablet (10 mg total) by mouth daily. For elevated blood pressure Patient not taking: Reported on 02/27/2015 12/12/14   Niel Hummer, NP  hydrOXYzine (ATARAX/VISTARIL) 25 MG tablet Take 1 tablet (25 mg total) by mouth 3 (three) times daily as needed for anxiety. Patient not taking: Reported on 02/27/2015 12/12/14   Niel Hummer, NP  LORazepam (ATIVAN) 1 MG tablet Take one tablet on 12/12/14 at 5 pm, then take one tablet 8 am on 12/13/14 and at 5 pm, take the last remaining tablet on 12/14/14 to finish alcohol detox protocol. Patient not taking: Reported on 02/27/2015 12/12/14   Niel Hummer, NP  Multiple Vitamin (MULTIVITAMIN WITH MINERALS) TABS tablet Take 1 tablet by mouth  daily. Patient not taking: Reported on 02/27/2015 12/12/14   Niel Hummer, NP  nicotine (NICODERM CQ - DOSED IN MG/24 HR) 7 mg/24hr patch Place 1 patch (7 mg total) onto the skin daily. Patient not taking: Reported on 02/27/2015 12/12/14   Niel Hummer, NP  PARoxetine (PAXIL) 20 MG tablet Take 1 tablet (20 mg total) by mouth daily. For depression Patient not taking: Reported on 02/27/2015 12/12/14   Niel Hummer, NP  traZODone (DESYREL) 50 MG tablet Take 1 tablet (50 mg total) by mouth at bedtime as needed for sleep. Patient not taking:  Reported on 02/27/2015 12/12/14   Niel Hummer, NP   BP 149/94 mmHg  Pulse 109  Temp(Src) 97.8 F (36.6 C) (Oral)  Resp 20  SpO2 99% Physical Exam  Constitutional: He is oriented to person, place, and time. He appears well-developed and well-nourished.  HENT:  Head: Normocephalic and atraumatic.  Eyes: Conjunctivae and EOM are normal.  Neck: Normal range of motion. Neck supple.  Cardiovascular: Normal rate, regular rhythm and normal heart sounds.   Pulmonary/Chest: Effort normal and breath sounds normal. No respiratory distress.  Abdominal: He exhibits no distension. There is tenderness in the epigastric area. There is no rebound and no guarding.  Musculoskeletal: Normal range of motion.  Neurological: He is alert and oriented to person, place, and time.  Skin: Skin is warm and dry.  Vitals reviewed.   ED Course  Procedures (including critical care time) Labs Review Labs Reviewed  CBC WITH DIFFERENTIAL/PLATELET - Abnormal; Notable for the following:    Platelets 46 (*)    Neutrophils Relative % 82 (*)    Neutro Abs 8.5 (*)    Lymphocytes Relative 11 (*)    All other components within normal limits  COMPREHENSIVE METABOLIC PANEL - Abnormal; Notable for the following:    Sodium 133 (*)    Chloride 99 (*)    Glucose, Bld 262 (*)    All other components within normal limits  LIPASE, BLOOD - Abnormal; Notable for the following:    Lipase 196 (*)    All other components within normal limits  URINALYSIS, ROUTINE W REFLEX MICROSCOPIC (NOT AT [facility]) - Abnormal; Notable for the following:    Glucose, UA 500 (*)    Ketones, ur 40 (*)    Protein, ur 30 (*)    All other components within normal limits  URINE RAPID DRUG SCREEN, HOSP PERFORMED  ETHANOL  URINE MICROSCOPIC-ADD ON  HEMOGLOBIN A1C    Imaging Review No results found. I have personally reviewed and evaluated these images and lab results as part of my medical decision-making.   EKG Interpretation None      MDM    Final diagnoses:  None    54 y.o. male with pertinent PMH of prior pancreatitis (idiopathic per pt report) presents with recurrent epigastric pain.  No ETOH, no ho biliary pathology.  Wu as above.  Admitted in stable condition.    I have reviewed all laboratory and imaging studies if ordered as above  No diagnosis found.      Debby Freiberg, MD 02/27/15 [phone]

## 2015-02-27 NOTE — ED Notes (Signed)
He c/o upper abd. Discomfort since Wed.  He recognizes this as being precisely like discomfort he had had ~5 years ago, at which time he was dx with pancreatitis which involved drainage of a small pseudocyst.  He is in no distress.

## 2015-02-27 NOTE — H&P (Signed)
[facility] History and Physical  Kieron Kantner Schack YJE:[id number] DOB: 10-May-1961 DOA: 02/27/2015  Referring physician: EDP PCP: [name], MD   Chief Complaint: ABDOMINAL pain, nausea, vomiting since Friday .  HPI: Luke White is a 54 y.o. male with h/o hypertension, not on meds, pancreatitis, last bout 5 years ago, chronic alcohol use, depression, presents with the above symptoms since Friday. His  On arrival to ED, his lipase was found to be elevated, his liver function panel is normal. Further lab work revealed low platelets. He was referred to medical service for admission for pancreatitis.    Review of Systems:  Constitutional:  No weight loss, night sweats, Fevers, chills, fatigue.  HEENT:  No headaches, Difficulty swallowing,Tooth/dental problems,Sore throat,  No sneezing, itching, ear ache, nasal congestion, post nasal drip,  Cardio-vascular:  No chest pain, Orthopnea, PND, swelling in lower extremities, anasarca, dizziness, palpitations  GI:  Nausea, vomiting and abdominal pain since Friday.  Resp:  No shortness of breath with exertion or at rest. No excess mucus, no productive cough, No non-productive cough, No coughing up of blood.No change in color of mucus.No wheezing.No chest wall deformity  Skin:  no rash or lesions.  GU:  no dysuria, change in color of urine, no urgency or frequency. No flank pain.  Musculoskeletal:  No joint pain or swelling. No decreased range of motion. No back pain.  Psych:  No change in mood or affect. No depression or anxiety. No memory loss.   Past Medical History  Diagnosis Date  . Anxiety   . GERD (gastroesophageal reflux disease)   . Allergic rhinitis   . Pancreatitis 2008    HX of  x2 ? cause   History reviewed. No pertinent past surgical history. Social History:  reports that he has quit smoking. His smoking use included Cigarettes. He has a 6.75 pack-year smoking history. He does not have any smokeless tobacco  history on file. He reports that he does not drink alcohol or use illicit drugs.  No Known Allergies  Family History  Problem Relation Age of Onset  . Anxiety disorder Other   . Heart disease Neg Hx   . Hypertension Mother   . Hypertension Father   . Hyperlipidemia Father     do not leave blank  Prior to Admission medications   Medication Sig Start Date End Date Taking? Authorizing Provider  acetaminophen (TYLENOL) 500 MG tablet Take 1,000 mg by mouth every 6 (six) hours as needed for moderate pain.   Yes Historical Provider, MD  buPROPion (WELLBUTRIN XL) 150 MG 24 hr tablet Take 150 mg by mouth daily.   Yes Historical Provider, MD  busPIRone (BUSPAR) 15 MG tablet Take 1 tablet (15 mg total) by mouth 2 (two) times daily. 12/12/14  Yes Niel Hummer, NP  cholecalciferol (VITAMIN D) 1000 UNITS tablet Take 1 tablet (1,000 Units total) by mouth daily. 12/12/14  Yes Niel Hummer, NP  ibuprofen (ADVIL,MOTRIN) 200 MG tablet Take 400 mg by mouth every 6 (six) hours as needed for moderate pain.   Yes Historical Provider, MD  omeprazole (PRILOSEC) 40 MG capsule Take 1 capsule (40 mg total) by mouth daily. 12/12/14  Yes Niel Hummer, NP  amLODipine (NORVASC) 10 MG tablet Take 1 tablet (10 mg total) by mouth daily. For elevated blood pressure Patient not taking: Reported on 02/27/2015 12/12/14   Niel Hummer, NP  hydrOXYzine (ATARAX/VISTARIL) 25 MG tablet Take 1 tablet (25 mg total) by mouth 3 (three) times  daily as needed for anxiety. Patient not taking: Reported on 02/27/2015 12/12/14   Niel Hummer, NP  Multiple Vitamin (MULTIVITAMIN WITH MINERALS) TABS tablet Take 1 tablet by mouth daily. Patient not taking: Reported on 02/27/2015 12/12/14   Niel Hummer, NP  nicotine (NICODERM CQ - DOSED IN MG/24 HR) 7 mg/24hr patch Place 1 patch (7 mg total) onto the skin daily. Patient not taking: Reported on 02/27/2015 12/12/14   Niel Hummer, NP   Physical Exam: Filed Vitals:   02/27/15 1201 02/27/15 1427  02/27/15 1617  BP: 163/108 149/94 160/105  Pulse: 120 109 111  Temp: 97.8 F (36.6 C)  98.3 F (36.8 C)  TempSrc: Oral  Oral  Resp: 20 20 20   Height:   5\' 11"  (1.803 m)  Weight:   72.6 kg (160 lb 0.9 oz)  SpO2: 99% 99% 99%    Wt Readings from Last 3 Encounters:  02/27/15 72.6 kg (160 lb 0.9 oz)  12/10/14 74.39 kg (164 lb)  03/15/14 79.833 kg (176 lb)    General:  Appears calm and comfortable Eyes: PERRL, normal lids, irises & conjunctiva ENT: grossly normal hearing, lips & tongue Neck: no LAD, masses or thyromegaly Cardiovascular: RRR, no m/r/g. No LE edema. Telemetry: SR, no arrhythmias  Respiratory: CTA bilaterally, no w/r/r. Normal respiratory effort. Abdomen: soft, tender in the epigastric area, no rebound tenderness. Bowel sounds present.  Skin: no rash or induration seen on limited exam Musculoskeletal: grossly normal tone BUE/BLE Psychiatric: grossly normal mood and affect, speech fluent and appropriate Neurologic: grossly non-focal.          Labs on Admission:  Basic Metabolic Panel:  Recent Labs Lab 02/27/15 1300  NA 133*  K 3.5  CL 99*  CO2 25  GLUCOSE 262*  BUN 6  CREATININE 0.69  CALCIUM 9.0   Liver Function Tests:  Recent Labs Lab 02/27/15 1300  AST 40  ALT 37  ALKPHOS 121  BILITOT 0.9  PROT 7.4  ALBUMIN 3.9    Recent Labs Lab 02/27/15 1300  LIPASE 196*   No results for input(s): AMMONIA in the last 168 hours. CBC:  Recent Labs Lab 02/27/15 1300  WBC 10.4  NEUTROABS 8.5*  HGB 14.6  HCT 41.8  MCV 89.7  PLT 46*   Cardiac Enzymes: No results for input(s): CKTOTAL, CKMB, CKMBINDEX, TROPONINI in the last 168 hours.  BNP (last 3 results) No results for input(s): BNP in the last 8760 hours.  ProBNP (last 3 results) No results for input(s): PROBNP in the last 8760 hours.  CBG:  Recent Labs Lab 02/27/15 1703  GLUCAP 193*    Radiological Exams on Admission: US Abdomen Limited Ruq  02/27/2015   CLINICAL DATA:  Right  upper quadrant abdominal pain. History of pancreatitis.  EXAM: US ABDOMEN LIMITED - RIGHT UPPER QUADRANT  COMPARISON:  Previous ultrasound, CT and MR examinations in 2011.  FINDINGS: Gallbladder:  No gallstones or wall thickening visualized. No sonographic Murphy sign noted.  Common bile duct:  Diameter: 3.8 mm  Liver:  Diffusely echogenic.  IMPRESSION: 1. No acute abnormality sonographically evident in the right upper quadrant of the abdomen. 2. Diffusely echogenic liver, most likely due to interval steatosis.   Electronically Signed   By: Claudie Revering M.D.   On: 02/27/2015 15:49      Assessment/Plan Active Problems:   Pancreatitis   Acute pancreatitis   Acute on chronic pancreatitis:  possibly brought on by the alcohol use.  His last alcohol intake  was on Friday evening.  Hydration,bowel rest, IV pain meds and anti emetics.    Hyperglycemia: evaluate for DM hgba1c ordered.    Hypertension: resume norvasc.   Anxiety and depression: resume wellbutrin  Smoking : Start nicotine patch.   Alcohol use: Ordered CIWA.      Code Status: FULL CODE.  DVT Prophylaxis: Family Communication: none at bedside.  Disposition Plan: admit to med surg.   Time spent: 60 min  [facility] Pager [phone]

## 2015-02-28 ENCOUNTER — Encounter (HOSPITAL_COMMUNITY): Payer: Self-pay | Admitting: Radiology

## 2015-02-28 ENCOUNTER — Inpatient Hospital Stay (HOSPITAL_COMMUNITY): Payer: BLUE CROSS/BLUE SHIELD

## 2015-02-28 LAB — GLUCOSE, CAPILLARY
GLUCOSE-CAPILLARY: 90 mg/dL (ref 65–99)
GLUCOSE-CAPILLARY: 94 mg/dL (ref 65–99)
GLUCOSE-CAPILLARY: 96 mg/dL (ref 65–99)
Glucose-Capillary: 91 mg/dL (ref 65–99)

## 2015-02-28 LAB — HEPATIC FUNCTION PANEL
ALBUMIN: 3.3 g/dL — AB (ref 3.5–5.0)
ALK PHOS: 103 U/L (ref 38–126)
ALT: 33 U/L (ref 17–63)
AST: 35 U/L (ref 15–41)
BILIRUBIN INDIRECT: 1 mg/dL — AB (ref 0.3–0.9)
Bilirubin, Direct: 0.2 mg/dL (ref 0.1–0.5)
TOTAL PROTEIN: 6.7 g/dL (ref 6.5–8.1)
Total Bilirubin: 1.2 mg/dL (ref 0.3–1.2)

## 2015-02-28 LAB — MAGNESIUM: MAGNESIUM: 1.8 mg/dL (ref 1.7–2.4)

## 2015-02-28 LAB — LIPASE, BLOOD: LIPASE: 65 U/L — AB (ref 22–51)

## 2015-02-28 MED ORDER — MAGNESIUM OXIDE 400 (241.3 MG) MG PO TABS
400.0000 mg | ORAL_TABLET | Freq: Two times a day (BID) | ORAL | Status: DC
  Administered 2015-02-28 – 2015-03-02 (×5): 400 mg via ORAL
  Filled 2015-02-28 (×5): qty 1

## 2015-02-28 MED ORDER — PANTOPRAZOLE SODIUM 40 MG IV SOLR
40.0000 mg | INTRAVENOUS | Status: DC
  Administered 2015-02-28: 40 mg via INTRAVENOUS
  Filled 2015-02-28: qty 40

## 2015-02-28 MED ORDER — IOHEXOL 300 MG/ML  SOLN
25.0000 mL | INTRAMUSCULAR | Status: AC
  Administered 2015-02-28 (×2): 25 mL via ORAL

## 2015-02-28 MED ORDER — IOHEXOL 300 MG/ML  SOLN
100.0000 mL | Freq: Once | INTRAMUSCULAR | Status: AC | PRN
  Administered 2015-02-28: 100 mL via INTRAVENOUS

## 2015-02-28 NOTE — Progress Notes (Signed)
[facility] PROGRESS NOTE  Luke White:[id number] DOB: 11/14/1960 DOA: 02/27/2015 PCP: [name], MD  Assessment/Plan: Active Problems:   Pancreatitis   Acute pancreatitis     Acute on chronic pancreatitis: Last episode was complicated by a pseudocyst, requiring endoscopic drainage in 2011.Likely secondary to continued alcohol use, His last alcohol intake was on Friday evening. Continue nothing by mouth Hydration,bowel rest, IV pain meds and anti emetics.  CT abdomen pelvis to further evaluate the patient's symptoms, right upper quadrant ultrasound was inconclusive  Hyperglycemia: evaluate for DM hemoglobin A1c pending Continue SSI   Hypertension: resume norvasc.   Anxiety and depression: resume wellbutrin  Smoking : Continue nicotine patch.   Alcohol use: Continue CIWA. , Started patient on IV Protonix   Code Status:      Code Status Orders        Start     Ordered   02/27/15 1608  Full code   Continuous     02/27/15 1607     Family Communication: family updated about patient's clinical progress Disposition Plan:  As above    Brief narrative: Luke White is a 54 y.o. male with h/o hypertension, not on meds, pancreatitis, last bout 5 years ago, chronic alcohol use, depression, presents with the above symptoms since Friday. His On arrival to ED, his lipase was found to be elevated, his liver function panel is normal. Further lab work revealed low platelets. He was referred to medical service for admission for pancreatitis.   Consultants:  None  Procedures:  None  Antibiotics: Anti-infectives    None         HPI/Subjective: Continues to have left upper quadrant pain, admits to drinking on a daily basis, if he does not drink he will start to withdraw  Objective: Filed Vitals:   02/27/15 1427 02/27/15 1617 02/27/15 2044 02/28/15 0457  BP: 149/94 160/105 150/96 146/87  Pulse: 109 111 96 91  Temp:  98.3 F (36.8 C)  98.2 F (36.8 C) 97.4 F (36.3 C)  TempSrc:  Oral Oral Oral  Resp: 20 20 18 18   Height:  5\' 11"  (1.803 m)    Weight:  72.6 kg (160 lb 0.9 oz)    SpO2: 99% 99% 100% 97%    Intake/Output Summary (Last 24 hours) at 02/28/15 1113 Last data filed at 02/28/15 0458  Gross per 24 hour  Intake      0 ml  Output    590 ml  Net   -590 ml    Exam:  General: No acute respiratory distress Lungs: Clear to auscultation bilaterally without wheezes or crackles Cardiovascular: Regular rate and rhythm without murmur gallop or rub normal S1 and S2 Abdomen: Nontender, nondistended, soft, bowel sounds positive, no rebound, no ascites, no appreciable mass Extremities: No significant cyanosis, clubbing, or edema bilateral lower extremities     Data Review   Micro Results No results found for this or any previous visit (from the past 240 hour(s)).  Radiology Reports US Abdomen Limited Ruq  02/27/2015   CLINICAL DATA:  Right upper quadrant abdominal pain. History of pancreatitis.  EXAM: US ABDOMEN LIMITED - RIGHT UPPER QUADRANT  COMPARISON:  Previous ultrasound, CT and MR examinations in 2011.  FINDINGS: Gallbladder:  No gallstones or wall thickening visualized. No sonographic Murphy sign noted.  Common bile duct:  Diameter: 3.8 mm  Liver:  Diffusely echogenic.  IMPRESSION: 1. No acute abnormality sonographically evident in the right upper quadrant of  the abdomen. 2. Diffusely echogenic liver, most likely due to interval steatosis.   Electronically Signed   By: Claudie Revering M.D.   On: 2015/03/19 15:49     CBC  Recent Labs Lab 03-19-2015 1300  WBC 10.4  HGB 14.6  HCT 41.8  PLT 46*  MCV 89.7  MCH 31.3  MCHC 34.9  RDW 13.1  LYMPHSABS 1.2  MONOABS 0.7  EOSABS 0.1  BASOSABS 0.0    Chemistries   Recent Labs Lab 03-19-2015 1300 02/28/15 0414  NA 133*  --   K 3.5  --   CL 99*  --   CO2 25  --   GLUCOSE 262*  --   BUN 6  --   CREATININE 0.69  --   CALCIUM 9.0  --   AST 40 35  ALT 37  33  ALKPHOS 121 103  BILITOT 0.9 1.2   ------------------------------------------------------------------------------------------------------------------ estimated creatinine clearance is 108.4 mL/min (by C-G formula based on Cr of 0.69). ------------------------------------------------------------------------------------------------------------------ No results for input(s): HGBA1C in the last 72 hours. ------------------------------------------------------------------------------------------------------------------ No results for input(s): CHOL, HDL, LDLCALC, TRIG, CHOLHDL, LDLDIRECT in the last 72 hours. ------------------------------------------------------------------------------------------------------------------ No results for input(s): TSH, T4TOTAL, T3FREE, THYROIDAB in the last 72 hours.  Invalid input(s): FREET3 ------------------------------------------------------------------------------------------------------------------ No results for input(s): VITAMINB12, FOLATE, FERRITIN, TIBC, IRON, RETICCTPCT in the last 72 hours.  Coagulation profile No results for input(s): INR, PROTIME in the last 168 hours.  No results for input(s): DDIMER in the last 72 hours.  Cardiac Enzymes No results for input(s): CKMB, TROPONINI, MYOGLOBIN in the last 168 hours.  Invalid input(s): CK ------------------------------------------------------------------------------------------------------------------ Invalid input(s): POCBNP   CBG:  Recent Labs Lab 2015-03-19 1703 19-Mar-2015 2130 02/28/15 0756  GLUCAP 193* 104* 91       Studies: US Abdomen Limited Ruq  03-19-15   CLINICAL DATA:  Right upper quadrant abdominal pain. History of pancreatitis.  EXAM: US ABDOMEN LIMITED - RIGHT UPPER QUADRANT  COMPARISON:  Previous ultrasound, CT and MR examinations in 2011.  FINDINGS: Gallbladder:  No gallstones or wall thickening visualized. No sonographic Murphy sign noted.  Common bile duct:   Diameter: 3.8 mm  Liver:  Diffusely echogenic.  IMPRESSION: 1. No acute abnormality sonographically evident in the right upper quadrant of the abdomen. 2. Diffusely echogenic liver, most likely due to interval steatosis.   Electronically Signed   By: Claudie Revering M.D.   On: Mar 19, 2015 15:49      Lab Results  Component Value Date   HGBA1C * 12/22/2009    5.8 (NOTE)                                                                       According to the ADA Clinical Practice Recommendations for 2011, when HbA1c is used as a screening test:   >=6.5%   Diagnostic of Diabetes Mellitus           (if abnormal result  is confirmed)  5.7-6.4%   Increased risk of developing Diabetes Mellitus  References:Diagnosis and Classification of Diabetes Mellitus,Diabetes OEUM,3536,14(ERXVQ 1):S62-S69 and Standards of Medical Care in         Diabetes - 2011,Diabetes MGQQ,7619,50  (Suppl 1):S11-S61.   HGBA1C 5.9 12/13/2008   HGBA1C 5.8 06/09/2008   Lab  Results  Component Value Date   LDLCALC 134* 03/15/2014   CREATININE 0.69 02/27/2015       Scheduled Meds: . buPROPion  150 mg Oral Daily  . busPIRone  15 mg Oral BID  . folic acid  1 mg Oral Daily  . insulin aspart  0-9 Units Subcutaneous TID WC  . multivitamin with minerals  1 tablet Oral Daily  . nicotine  7 mg Transdermal Daily  . pantoprazole (PROTONIX) IV  40 mg Intravenous Q24H  . thiamine  100 mg Oral Daily   Or  . thiamine  100 mg Intravenous Daily   Continuous Infusions: . sodium chloride 150 mL/hr at 02/28/15 0455    Active Problems:   Pancreatitis   Acute pancreatitis    Time spent: 45 minutes   [facility] Pager [phone]. If 7PM-7AM, please contact night-coverage at [email or web address], password [facility] 02/28/2015, 11:13 AM  LOS: 1 day

## 2015-03-01 ENCOUNTER — Encounter (HOSPITAL_BASED_OUTPATIENT_CLINIC_OR_DEPARTMENT_OTHER): Payer: Self-pay | Admitting: Internal Medicine

## 2015-03-01 DIAGNOSIS — F418 Other specified anxiety disorders: Secondary | ICD-10-CM

## 2015-03-01 DIAGNOSIS — IMO0002 Reserved for concepts with insufficient information to code with codable children: Secondary | ICD-10-CM

## 2015-03-01 LAB — COMPREHENSIVE METABOLIC PANEL
ALBUMIN: 3.3 g/dL — AB (ref 3.5–5.0)
ALK PHOS: 103 U/L (ref 38–126)
ALT: 35 U/L (ref 17–63)
ANION GAP: 8 (ref 5–15)
AST: 43 U/L — AB (ref 15–41)
BUN: 6 mg/dL (ref 6–20)
CO2: 26 mmol/L (ref 22–32)
Calcium: 8.4 mg/dL — ABNORMAL LOW (ref 8.9–10.3)
Chloride: 99 mmol/L — ABNORMAL LOW (ref 101–111)
Creatinine, Ser: 0.59 mg/dL — ABNORMAL LOW (ref 0.61–1.24)
GFR calc Af Amer: >60 mL/min (ref >60)
GFR calc non Af Amer: >60 mL/min (ref >60)
GLUCOSE: 130 mg/dL — AB (ref 65–99)
Potassium: 3.3 mmol/L — ABNORMAL LOW (ref 3.5–5.1)
SODIUM: 133 mmol/L — AB (ref 135–145)
Total Bilirubin: 0.7 mg/dL (ref 0.3–1.2)
Total Protein: 6.6 g/dL (ref 6.5–8.1)

## 2015-03-01 LAB — GLUCOSE, CAPILLARY
GLUCOSE-CAPILLARY: 186 mg/dL — AB (ref 65–99)
Glucose-Capillary: 230 mg/dL — ABNORMAL HIGH (ref 65–99)
Glucose-Capillary: 141 mg/dL — ABNORMAL HIGH (ref 65–99)
Glucose-Capillary: 173 mg/dL — ABNORMAL HIGH (ref 65–99)

## 2015-03-01 LAB — CBC
HCT: 33.9 % — ABNORMAL LOW (ref 39.0–52.0)
HEMOGLOBIN: 11.3 g/dL — AB (ref 13.0–17.0)
MCH: 30.5 pg (ref 26.0–34.0)
MCHC: 33.3 g/dL (ref 30.0–36.0)
MCV: 91.4 fL (ref 78.0–100.0)
PLATELETS: UNDETERMINED 10*3/uL (ref 150–400)
RBC: 3.71 MIL/uL — AB (ref 4.22–5.81)
RDW: 13 % (ref 11.5–15.5)
WBC: 5.3 10*3/uL (ref 4.0–10.5)

## 2015-03-01 LAB — HEMOGLOBIN A1C
HEMOGLOBIN A1C: 7.5 % — AB (ref 4.8–5.6)
Mean Plasma Glucose: 169 mg/dL

## 2015-03-01 LAB — LIPASE, BLOOD: Lipase: 48 U/L (ref 22–51)

## 2015-03-01 MED ORDER — DIAZEPAM 5 MG PO TABS
5.0000 mg | ORAL_TABLET | Freq: Four times a day (QID) | ORAL | Status: DC | PRN
  Administered 2015-03-01 – 2015-03-02 (×4): 5 mg via ORAL
  Filled 2015-03-01 (×4): qty 1

## 2015-03-01 MED ORDER — POTASSIUM CHLORIDE CRYS ER 20 MEQ PO TBCR
40.0000 meq | EXTENDED_RELEASE_TABLET | ORAL | Status: AC
  Administered 2015-03-01 (×2): 40 meq via ORAL
  Filled 2015-03-01 (×2): qty 2

## 2015-03-01 MED ORDER — AMLODIPINE BESYLATE 5 MG PO TABS
5.0000 mg | ORAL_TABLET | Freq: Every day | ORAL | Status: DC
  Administered 2015-03-01 – 2015-03-02 (×2): 5 mg via ORAL
  Filled 2015-03-01 (×2): qty 1

## 2015-03-01 MED ORDER — ZOLPIDEM TARTRATE 5 MG PO TABS: 5.0000 mg | ORAL_TABLET | Freq: Every evening | ORAL | Status: DC | PRN

## 2015-03-01 MED ORDER — POTASSIUM CHLORIDE CRYS ER 20 MEQ PO TBCR: 40.0000 meq | EXTENDED_RELEASE_TABLET | Freq: Once | ORAL | Status: DC

## 2015-03-01 MED ORDER — ZOLPIDEM TARTRATE 5 MG PO TABS
5.0000 mg | ORAL_TABLET | Freq: Every evening | ORAL | Status: DC | PRN
  Administered 2015-03-01: 5 mg via ORAL
  Filled 2015-03-01: qty 1

## 2015-03-01 MED ORDER — PANTOPRAZOLE SODIUM 40 MG PO TBEC
40.0000 mg | DELAYED_RELEASE_TABLET | Freq: Every day | ORAL | Status: DC
  Administered 2015-03-01 – 2015-03-02 (×2): 40 mg via ORAL
  Filled 2015-03-01 (×2): qty 1

## 2015-03-01 NOTE — Progress Notes (Signed)
Key Points: Use following P&T approved IV to PO non-antibiotic change policy.  Description contains the criteria that are approved Note: Policy Excludes:  Esophagectomy patientsPHARMACIST - PHYSICIAN COMMUNICATION DR:   Allyson Sabal CONCERNING: IV to Oral Route Change Policy  RECOMMENDATION: This patient is receiving protonix by the intravenous route.  Based on criteria approved by the Pharmacy and Therapeutics Committee, the intravenous medication(s) is/are being converted to the equivalent oral dose form(s).   DESCRIPTION: These criteria include:  The patient is eating (either orally or via tube) and/or has been taking other orally administered medications for a least 24 hours  The patient has no evidence of active gastrointestinal bleeding or impaired GI absorption (gastrectomy, short bowel, patient on TNA or NPO).  If you have questions about this conversion, please contact the Pharmacy Department  []   [phone] )  [facility] []   [phone] )  [facility]  []   [phone] )  [facility] [x]   [phone] )  [place], [place], [facility] 03/01/2015 8:57 AM

## 2015-03-01 NOTE — Progress Notes (Addendum)
[facility] PROGRESS NOTE  Luke White VEL:[id number] DOB: April 11, 1961 DOA: 02/27/2015 PCP: [name], MD  Assessment/Plan: Active Problems:   Pancreatitis   Acute pancreatitis     Acute on chronic pancreatitis: Last episode was complicated by a pseudocyst in 2011, requiring endoscopic drainage in 2011.Likely secondary to continued alcohol use, His last alcohol intake was on Friday evening. Continue nothing by mouth Hydration,bowel rest, IV pain meds and anti emetics.  CT abdomen pelvis shows mild pancreatitis and progressive dilation of pancreatis duct, and changes of chronic calcific pancreatitis. , right upper quadrant ultrasound was inconclusive Patient has an appointment with Dr. Henrene Pastor for Oct 27th at 10 ;45 am  Patient is known to Dr [name] [phone]), who visited patient today on an informal basis and convinced him to [facility] to [facility], will place social work consult  to expedite the process. Please contact Dr. Arnoldo Morale if unable to find a spot in [facility].   Hyperglycemia: Uncontrolled, evaluate for DM hemoglobin A1c 7.5 Continue SSI , may discharge home on oral hypoglycemic  Thrombocytopenia likely sickly to alcohol use  Hypokalemia replete  Hypertension: Restart norvasc.   Anxiety and depression: resume wellbutrin  Smoking : Continue nicotine patch.   Alcohol use: Patient has started showing symptoms of alcohol withdrawal today, started on by mouth Valium every 6 hours when necessary in addition to CIWA protocol, continue Protonix, thiamine, folic acid  Disposition anticipate discharge in one to 2 days   Code Status:      Code Status Orders        Start     Ordered   02/27/15 1608  Full code   Continuous     02/27/15 1607     Family Communication: family updated about patient's clinical progress Disposition Plan:  As above    Brief narrative: Luke White is a 54 y.o. male with h/o hypertension, not on  meds, pancreatitis, last bout 5 years ago, chronic alcohol use, depression, presents with the above symptoms since Friday. His On arrival to ED, his lipase was found to be elevated, his liver function panel is normal. Further lab work revealed low platelets. He was referred to medical service for admission for pancreatitis.   Consultants:  None  Procedures:  None  Antibiotics: Anti-infectives    None         HPI/Subjective: Patient wandering the halls, confused, extremely tremulous  Objective: Filed Vitals:   02/28/15 0457 02/28/15 1409 02/28/15 2048 03/01/15 0556  BP: 146/87 150/88 142/88 151/90  Pulse: 91 91 85 84  Temp: 97.4 F (36.3 C) 97.7 F (36.5 C) 97.6 F (36.4 C) 98.2 F (36.8 C)  TempSrc: Oral Oral Oral Oral  Resp: 18 18 18 18   Height:      Weight:      SpO2: 97% 99% 99% 100%    Intake/Output Summary (Last 24 hours) at 03/01/15 1209 Last data filed at 03/01/15 1100  Gross per 24 hour  Intake    780 ml  Output   1600 ml  Net   -820 ml    Exam:  General: Anxious Lungs: Clear to auscultation bilaterally without wheezes or crackles Cardiovascular: Regular rate and rhythm without murmur gallop or rub normal S1 and S2 Abdomen: Nontender, nondistended, soft, bowel sounds positive, no rebound, no ascites, no appreciable mass Extremities: No significant cyanosis, clubbing, or edema bilateral lower extremities     Data Review   Micro Results No results  found for this or any previous visit (from the past 240 hour(s)).  Radiology Reports Ct Abdomen Pelvis W Contrast  02/28/2015   CLINICAL DATA:  Epigastric abdominal pain since 02/23/2015. Nausea and vomiting. 29 lb weight loss over the past 6 months. Night sweats.  EXAM: CT ABDOMEN AND PELVIS WITH CONTRAST  TECHNIQUE: Multidetector CT imaging of the abdomen and pelvis was performed using the standard protocol following bolus administration of intravenous contrast.  CONTRAST:  147mL OMNIPAQUE IOHEXOL  300 MG/ML  SOLN  COMPARISON:  Abdomen ultrasound dated 02/27/2015, abdomen CT dated 01/19/2008 and abdomen and pelvis CT dated 03/11/2006.  FINDINGS: Diffuse low density of the liver relative to the spleen.  Interval decrease in size of the previously demonstrated large cystic area in the head of the pancreas. This measured 5.1 cm in maximum diameter on 01/19/2008 and currently measures 3.5 cm in maximum diameter. Coarse pancreatic calcifications are again demonstrated. Progressive dilatation of the pancreatic duct is demonstrated with a maximum diameter of 9.4 mm on image number 29. There is also mild peripancreatic soft tissue stranding on the current images. No new fluid collections are seen.  Unremarkable spleen, gallbladder, adrenal glands, kidneys, ureters and urinary bladder. Minimally enlarged prostate gland containing some coarse calcifications.  No gastrointestinal abnormalities or enlarged lymph nodes. Minimal dependent atelectasis or scarring at both lung bases. Minimal bilateral hip degenerative changes. Lower thoracic spine degenerative changes. Minimal lumbar spine degenerative changes.  IMPRESSION: 1. Mild changes of acute pancreatitis. 2. Changes of chronic pancreatitis with progressive pancreatic ductal dilatation. 3. Interval decrease in size of a pancreatic head pseudocyst. 4. Diffuse hepatic steatosis.   Electronically Signed   By: Claudie Revering M.D.   On: 02/28/2015 12:33   US Abdomen Limited Ruq  02/27/2015   CLINICAL DATA:  Right upper quadrant abdominal pain. History of pancreatitis.  EXAM: US ABDOMEN LIMITED - RIGHT UPPER QUADRANT  COMPARISON:  Previous ultrasound, CT and MR examinations in 2011.  FINDINGS: Gallbladder:  No gallstones or wall thickening visualized. No sonographic Murphy sign noted.  Common bile duct:  Diameter: 3.8 mm  Liver:  Diffusely echogenic.  IMPRESSION: 1. No acute abnormality sonographically evident in the right upper quadrant of the abdomen. 2. Diffusely echogenic  liver, most likely due to interval steatosis.   Electronically Signed   By: Claudie Revering M.D.   On: 02/27/2015 15:49     CBC  Recent Labs Lab 02/27/15 1300 03/01/15 0436  WBC 10.4 5.3  HGB 14.6 11.3*  HCT 41.8 33.9*  PLT 46* PLATELET CLUMPS NOTED ON SMEAR, UNABLE TO ESTIMATE  MCV 89.7 91.4  MCH 31.3 30.5  MCHC 34.9 33.3  RDW 13.1 13.0  LYMPHSABS 1.2  --   MONOABS 0.7  --   EOSABS 0.1  --   BASOSABS 0.0  --     Chemistries   Recent Labs Lab 02/27/15 1300 02/28/15 0414 03/01/15 0436  NA 133*  --  133*  K 3.5  --  3.3*  CL 99*  --  99*  CO2 25  --  26  GLUCOSE 262*  --  130*  BUN 6  --  6  CREATININE 0.69  --  0.59*  CALCIUM 9.0  --  8.4*  MG  --  1.8  --   AST 40 35 43*  ALT 37 33 35  ALKPHOS 121 103 103  BILITOT 0.9 1.2 0.7   ------------------------------------------------------------------------------------------------------------------ estimated creatinine clearance is 108.4 mL/min (by C-G formula based on Cr of 0.59). ------------------------------------------------------------------------------------------------------------------  Recent Labs  02/27/15 1317  HGBA1C 7.5*   ------------------------------------------------------------------------------------------------------------------ No results for input(s): CHOL, HDL, LDLCALC, TRIG, CHOLHDL, LDLDIRECT in the last 72 hours. ------------------------------------------------------------------------------------------------------------------ No results for input(s): TSH, T4TOTAL, T3FREE, THYROIDAB in the last 72 hours.  Invalid input(s): FREET3 ------------------------------------------------------------------------------------------------------------------ No results for input(s): VITAMINB12, FOLATE, FERRITIN, TIBC, IRON, RETICCTPCT in the last 72 hours.  Coagulation profile No results for input(s): INR, PROTIME in the last 168 hours.  No results for input(s): DDIMER in the last 72 hours.  Cardiac  Enzymes No results for input(s): CKMB, TROPONINI, MYOGLOBIN in the last 168 hours.  Invalid input(s): CK ------------------------------------------------------------------------------------------------------------------ Invalid input(s): POCBNP   CBG:  Recent Labs Lab 02/28/15 1214 02/28/15 1633 02/28/15 2200 03/01/15 0815 03/01/15 1153  GLUCAP 90 94 96 230* 141*       Studies: Ct Abdomen Pelvis W Contrast  02/28/2015   CLINICAL DATA:  Epigastric abdominal pain since 02/23/2015. Nausea and vomiting. 29 lb weight loss over the past 6 months. Night sweats.  EXAM: CT ABDOMEN AND PELVIS WITH CONTRAST  TECHNIQUE: Multidetector CT imaging of the abdomen and pelvis was performed using the standard protocol following bolus administration of intravenous contrast.  CONTRAST:  166mL OMNIPAQUE IOHEXOL 300 MG/ML  SOLN  COMPARISON:  Abdomen ultrasound dated 02/27/2015, abdomen CT dated 01/19/2008 and abdomen and pelvis CT dated 03/11/2006.  FINDINGS: Diffuse low density of the liver relative to the spleen.  Interval decrease in size of the previously demonstrated large cystic area in the head of the pancreas. This measured 5.1 cm in maximum diameter on 01/19/2008 and currently measures 3.5 cm in maximum diameter. Coarse pancreatic calcifications are again demonstrated. Progressive dilatation of the pancreatic duct is demonstrated with a maximum diameter of 9.4 mm on image number 29. There is also mild peripancreatic soft tissue stranding on the current images. No new fluid collections are seen.  Unremarkable spleen, gallbladder, adrenal glands, kidneys, ureters and urinary bladder. Minimally enlarged prostate gland containing some coarse calcifications.  No gastrointestinal abnormalities or enlarged lymph nodes. Minimal dependent atelectasis or scarring at both lung bases. Minimal bilateral hip degenerative changes. Lower thoracic spine degenerative changes. Minimal lumbar spine degenerative changes.   IMPRESSION: 1. Mild changes of acute pancreatitis. 2. Changes of chronic pancreatitis with progressive pancreatic ductal dilatation. 3. Interval decrease in size of a pancreatic head pseudocyst. 4. Diffuse hepatic steatosis.   Electronically Signed   By: Claudie Revering M.D.   On: 02/28/2015 12:33   US Abdomen Limited Ruq  02/27/2015   CLINICAL DATA:  Right upper quadrant abdominal pain. History of pancreatitis.  EXAM: US ABDOMEN LIMITED - RIGHT UPPER QUADRANT  COMPARISON:  Previous ultrasound, CT and MR examinations in 2011.  FINDINGS: Gallbladder:  No gallstones or wall thickening visualized. No sonographic Murphy sign noted.  Common bile duct:  Diameter: 3.8 mm  Liver:  Diffusely echogenic.  IMPRESSION: 1. No acute abnormality sonographically evident in the right upper quadrant of the abdomen. 2. Diffusely echogenic liver, most likely due to interval steatosis.   Electronically Signed   By: Claudie Revering M.D.   On: 02/27/2015 15:49      Lab Results  Component Value Date   HGBA1C 7.5* 02/27/2015   HGBA1C * 12/22/2009    5.8 (NOTE)  According to the ADA Clinical Practice Recommendations for 2011, when HbA1c is used as a screening test:   >=6.5%   Diagnostic of Diabetes Mellitus           (if abnormal result  is confirmed)  5.7-6.4%   Increased risk of developing Diabetes Mellitus  References:Diagnosis and Classification of Diabetes Mellitus,Diabetes NOTR,7116,57(XUXYB 1):S62-S69 and Standards of Medical Care in         Diabetes - 2011,Diabetes FXOV,2919,16  (Suppl 1):S11-S61.   HGBA1C 5.9 12/13/2008   Lab Results  Component Value Date   LDLCALC 134* 03/15/2014   CREATININE 0.59* 03/01/2015       Scheduled Meds: . buPROPion  150 mg Oral Daily  . busPIRone  15 mg Oral BID  . folic acid  1 mg Oral Daily  . insulin aspart  0-9 Units Subcutaneous TID WC  . magnesium oxide  400 mg Oral BID  . multivitamin with minerals  1  tablet Oral Daily  . nicotine  7 mg Transdermal Daily  . pantoprazole  40 mg Oral Daily  . potassium chloride  40 mEq Oral Q4H  . thiamine  100 mg Oral Daily   Or  . thiamine  100 mg Intravenous Daily   Continuous Infusions: . sodium chloride 150 mL/hr at 03/01/15 6060    Active Problems:   Pancreatitis   Acute pancreatitis    Time spent: 45 minutes   [facility] Pager [phone]. If 7PM-7AM, please contact night-coverage at [email or web address], password [facility] 03/01/2015, 12:09 PM  LOS: 2 days

## 2015-03-01 NOTE — Progress Notes (Signed)
At the request of the patient I am writing a note to document our discussion of his need for a 28 day program such as [facility] [facility]. He is aware and agrees with this recommendation based on recent binge drinking, ETOH hepatitis and severe depression with a prior suicide attempt.  I discussed his disposition with his attending and strongly recommend that he be transitioned at discharge to [facility].

## 2015-03-01 NOTE — Progress Notes (Signed)
Initial Nutrition Assessment  INTERVENTION:   Encourage PO intake RD to continue to monitor for needs  NUTRITION DIAGNOSIS:   Increased nutrient needs related to other (see comment) (Pancreatitis) as evidenced by estimated needs.  GOAL:   Patient will meet greater than or equal to 90% of their needs  MONITOR:   PO intake, Supplement acceptance, Labs, Weight trends, Skin, I & O's  REASON FOR ASSESSMENT:   Malnutrition Screening Tool    ASSESSMENT:   54 y.o. male with h/o hypertension, not on meds, pancreatitis, last bout 5 years ago, chronic alcohol use, depression, presents with the above symptoms since Friday.   Pt in room, just received his lunch tray. Pt states his appetite has improved, eating 100%. Pt states his appetite decreases when the pain from his pancreatitis is bad. Pt reports having Ensure supplements at home that he bought from [place] not interested in supplements now that he is eating well. Encouraged pt to request supplement if his appetite decreases during this admission.   Per pt, he has lost 8 lb over the last week (5% weight loss x 1 week, significant for time frame).  Nutrition focused physical exam shows no sign of depletion of muscle mass or body fat.  Labs reviewed: CBGs: 96-230 Low Na, K, Creatinine Mg WNL  Diet Order:  Diet Heart Room service appropriate?: Yes; Fluid consistency:: Thin  Skin:  Reviewed, no issues  Last BM:  9/2  Height:   Ht Readings from Last 1 Encounters:  02/27/15 5\' 11"  (1.803 m)    Weight:   Wt Readings from Last 1 Encounters:  02/27/15 160 lb 0.9 oz (72.6 kg)    Ideal Body Weight:  78.2 kg  BMI:  Body mass index is 22.33 kg/(m^2).  Estimated Nutritional Needs:   Kcal:  2100-2300  Protein:  105-115g  Fluid:  2.2L/day  EDUCATION NEEDS:   No education needs identified at this time  Clayton Bibles, MS, RD, LDN Pager: [phone] After Hours Pager: [phone]

## 2015-03-01 NOTE — Care Management Note (Signed)
Case Management Note  Patient Details  Name: HILDA WEXLER MRN: [id number] Date of Birth: [birth year]  Subjective/Objective:                   pancreatitis Action/Plan:  Discharge planning Expected Discharge Date:                  Expected Discharge Plan:  Home/Self Care  In-House Referral:     Discharge planning Services  CM Consult  Post Acute Care Choice:    Choice offered to:     DME Arranged:    DME Agency:     HH Arranged:    HH Agency:     Status of Service:  In process, will continue to follow  Medicare Important Message Given:    Date Medicare IM Given:    Medicare IM give by:    Date Additional Medicare IM Given:    Additional Medicare Important Message give by:     If discussed at [place] of Stay Meetings, dates discussed:    Additional Comments: Utilization Review Complete.  Pt from home and plan is to return home post hospitalization.  CM will continue to monitor for needs. Dellie Catholic, RN 03/01/2015, 11:49 AM

## 2015-03-01 NOTE — Progress Notes (Signed)
Patient ID: Luke White, male   DOB: [birth year], 54 y.o.   MRN: [id number]   Asked by  hospitalist   to review CT . Pt with hx of chronic pancreatitis. Known to Dr. Henrene Pastor but not seen in several years. He had MRI in 2011 showing chronic pancreatitis and a dilated Pancreatic duct,with beading , as well as a pseudocyst.  Ct this admission shows mild pancreatitis and progressive dilation of pancreatis duct, and changes of chronic calcific pancreatitis.  Have made him an outpt  appt with Dr. Henrene Pastor for Oct 27th at 76 ;65 am

## 2015-03-02 DIAGNOSIS — F329 Major depressive disorder, single episode, unspecified: Secondary | ICD-10-CM

## 2015-03-02 DIAGNOSIS — K861 Other chronic pancreatitis: Secondary | ICD-10-CM

## 2015-03-02 DIAGNOSIS — F32A Depression, unspecified: Secondary | ICD-10-CM

## 2015-03-02 DIAGNOSIS — K860 Alcohol-induced chronic pancreatitis: Secondary | ICD-10-CM

## 2015-03-02 DIAGNOSIS — K852 Alcohol induced acute pancreatitis: Secondary | ICD-10-CM

## 2015-03-02 DIAGNOSIS — F17200 Nicotine dependence, unspecified, uncomplicated: Secondary | ICD-10-CM

## 2015-03-02 DIAGNOSIS — F419 Anxiety disorder, unspecified: Secondary | ICD-10-CM

## 2015-03-02 DIAGNOSIS — I10 Essential (primary) hypertension: Secondary | ICD-10-CM

## 2015-03-02 DIAGNOSIS — F1010 Alcohol abuse, uncomplicated: Secondary | ICD-10-CM

## 2015-03-02 DIAGNOSIS — F418 Other specified anxiety disorders: Secondary | ICD-10-CM

## 2015-03-02 DIAGNOSIS — E119 Type 2 diabetes mellitus without complications: Secondary | ICD-10-CM

## 2015-03-02 LAB — COMPREHENSIVE METABOLIC PANEL
ALBUMIN: 3.6 g/dL (ref 3.5–5.0)
ALK PHOS: 116 U/L (ref 38–126)
ALT: 42 U/L (ref 17–63)
ANION GAP: 7 (ref 5–15)
AST: 50 U/L — AB (ref 15–41)
BILIRUBIN TOTAL: 0.2 mg/dL — AB (ref 0.3–1.2)
BUN: 9 mg/dL (ref 6–20)
CALCIUM: 8.8 mg/dL — AB (ref 8.9–10.3)
CO2: 28 mmol/L (ref 22–32)
Chloride: 102 mmol/L (ref 101–111)
Creatinine, Ser: 0.63 mg/dL (ref 0.61–1.24)
GFR calc Af Amer: >60 mL/min (ref >60)
GLUCOSE: 226 mg/dL — AB (ref 65–99)
Potassium: 3.8 mmol/L (ref 3.5–5.1)
Sodium: 137 mmol/L (ref 135–145)
TOTAL PROTEIN: 7.3 g/dL (ref 6.5–8.1)

## 2015-03-02 LAB — CBC
HCT: 34.6 % — ABNORMAL LOW (ref 39.0–52.0)
Hemoglobin: 11.3 g/dL — ABNORMAL LOW (ref 13.0–17.0)
MCH: 30.4 pg (ref 26.0–34.0)
MCHC: 32.7 g/dL (ref 30.0–36.0)
MCV: 93 fL (ref 78.0–100.0)
Platelets: UNDETERMINED 10*3/uL (ref 150–400)
RBC: 3.72 MIL/uL — ABNORMAL LOW (ref 4.22–5.81)
RDW: 13.2 % (ref 11.5–15.5)
WBC: 4.5 10*3/uL (ref 4.0–10.5)

## 2015-03-02 LAB — GLUCOSE, CAPILLARY: GLUCOSE-CAPILLARY: 194 mg/dL — AB (ref 65–99)

## 2015-03-02 NOTE — Clinical Social Work Note (Signed)
Clinical Social Work Assessment  Patient Details  Name: Luke White MRN: [id number] Date of Birth: Jun 11, 1961  Date of referral:  03/02/15               Reason for consult:  Substance Use/ETOH Abuse                Permission sought to share information with:    Permission granted to share information::  Yes, Verbal Permission Granted  Name::     Dr. Arnoldo Morale  Agency::     Relationship::     Contact Information:     Housing/Transportation Living arrangements for the past 2 months:  [place] of Information:  Patient Patient Interpreter Needed:  None Criminal Activity/Legal Involvement Pertinent to Current Situation/Hospitalization:  No - Comment as needed Significant Relationships:  Friend Lives with:  Self Do you feel safe going back to the place where you live?  Yes Need for family participation in patient care:  No (Coment)  Care giving concerns:  CSW received referral that pt interested in exploring [facility] for substance abuse treatment.    Social Worker assessment / plan:    CSW met with pt at bedside. CSW introduced self and explained role. Pt states that he is agreeable to [facility] and there is "a bed available for him". CSW discussed with pt that CSW will need to contact [facility] to confirm. CSW had pt sign Release of Information (placed in shadow chart) for CSW to contact [facility].   CSW contacted [facility] to inquire about pt. [facility] stated that they do not have a referral on pt and currently do not have any beds available. [facility] stated that CSW can fax over a face sheet for pt and that it will be best for CSW to notify pt to contact [facility] once discharged from the hospital and [facility] can work with pt regarding potential admission to the facility. CSW faxed face sheet to [facility].  CSW followed up with pt at bedside. CSW updated pt that referral had not yet been sent to  [facility] and facility does not currently have any beds available. CSW discussed with pt that pt will need to contact [facility] following discharge in order to follow up regarding admission to [facility]. Pt is agreeable to do this. CSW provided pt with Outpatient/Inpatient substance abuse resources and contact information for [employer].   CSW contacted Dr. Arnoldo Morale and notified Dr. Arnoldo Morale that [facility] is full and pt has information to follow up with [facility] on an outpatient basis. Dr. Arnoldo Morale states that he will assist with this process from outpatient.   Pt very anxious to be discharged from the hospital now that he has the information regarding [facility]. RN notified.   No further social work needs identified at this time.  CSW signing off.   Employment status:  Therapist, music:  Managed Care PT Recommendations:  Not assessed at this time Information / Referral to community resources:  Residential Substance Abuse Treatment Options  Patient/Family's Response to care:  Pt alert and oriented x 4. Pt very anxious and pacing about room upon CSW entering pt room. Pt appreciative of CSW assistance in looking into [facility] and states that he plans to follow up with [facility] upon discharge.  Patient/Family's Understanding of and Emotional Response to Diagnosis, Current Treatment, and Prognosis:  Pt states that he wants to seek help at [facility].  CSW unable to complete SBIRT do to pt anxiousness and wanting to speak with RN about discharging from the hospital.   Emotional Assessment Appearance:  Appears stated age Attitude/Demeanor/Rapport:  Other (appropriate) Affect (typically observed):  Anxious Orientation:  Oriented to Self, Oriented to Place, Oriented to  Time, Oriented to Situation Alcohol / Substance use:  Not Applicable Psych involvement (Current and /or in the community):  No (Comment)  Discharge Needs   Concerns to be addressed:  Substance Abuse Concerns Readmission within the last 30 days:  No Current discharge risk:  Substance Abuse Barriers to Discharge:  No Barriers Identified   Deema Juncaj, [place], LCSW 03/02/2015, 1:48 PM [phone]

## 2015-03-02 NOTE — Progress Notes (Signed)
Discussed discharge with patient.  Informed him that I was getting his discharge instructions prepared and would have him sign same.  Patient has been anxious and pacing the hall and attempted to take his IV out.  IV was removed and asked patient to be patient and I would be back with his further appointment and discharge instructions.  Patient waited by nurse's station and sister arrived.  Spoke with sister and informed her what we doing.  While attending to another patient, Luke White proceeded to leave the area with his sister.  I called patient and informed him (left voicemail) that I had his discharge instructions here.  I informed him to come by and pick them up if he could.  Patient was ambulatory upon discharge.  Informed charge nurse and attending MD.

## 2015-03-02 NOTE — Care Management Note (Signed)
Case Management Note  Patient Details  Name: Luke White MRN: [id number] Date of Birth: 1960/06/26  Subjective/Objective:  Per huddle-patient to [facility].                  Action/Plan:d/c home. No d/c needs or orders.   Expected Discharge Date:                  Expected Discharge Plan:  Home/Self Care  In-House Referral:     Discharge planning Services  CM Consult  Post Acute Care Choice:    Choice offered to:     DME Arranged:    DME Agency:     HH Arranged:    [place] Agency:     Status of Service:  Completed, signed off  Medicare Important Message Given:    Date Medicare IM Given:    Medicare IM give by:    Date Additional Medicare IM Given:    Additional Medicare Important Message give by:     If discussed at [place] of Stay Meetings, dates discussed:    Additional Comments:  Dessa Phi, RN 03/02/2015, 12:09 PM

## 2015-03-02 NOTE — Discharge Summary (Signed)
Physician Discharge Summary  Luke White [id number]:[id number] DOB: 1960/10/04 DOA: 02/27/2015  PCP: [name], MD  Admit date: 02/27/2015 Discharge date: 03/02/2015  Time spent: 50 minutes  Recommendations for Outpatient Follow-up:  1. hesitant to take new prescriptions or discuss DM, HTN, and other medical issues-states he will talk with Dr [name] whom he wants to see soon  2. Please repeat CBC in a citrate tube to assess for thrombocytopenia  Discharge Condition: stable Diet recommendation: diabetic low sodium heart healthy   Discharge Diagnoses:  Principal Problem:   Acute pancreatitis Active Problems:   GERD   HTN (hypertension)   DM type 2 (diabetes mellitus, type 2)   Anxiety and depression   Smoker   Alcohol abuse- binge drinking   Chronic pancreatitis  fatty liver  History of present illness:  54 y/o male with HTN, chronic pancreatitis, chronic binge drinking, depression admitted for abdominal pain, vomiting and was found to have an elevated Lipase and thrombocytopenia.   Hospital Course:  Acute on chronic pancreatitis -Related to ongoing alcohol abuse -CT revealed mild acute pancreatitis superimposed on changes of chronic pancreatitis, resolving pseudocyst - resolved- tolerating solid food- lipase normalized  Fatty liver -Noted on CT and likely also secondary to alcohol abuse  Thrombocytopenia? -1 reading of 46 obtained on 9/4-subsequent blood draws reported as "platelet clumps " -Repeat as outpatient in citrated tube  Alcohol abuse -Binge drinking-admits to anxiety-has a chronic tremor and states that he is not had any withdrawal symptoms -Dr. [name] whom the patient states is a personal friend of his has come to speak with him and encouraged him to go to [place] to go to [facility]-states the bed will be available hopefully by tomorrow  Diabetes mellitus - Noted to have Hyperglycemia-A1c 7.5 -He refuses to discuss management of  diabetes and treatment options and states that he will discuss them with Dr. [name]  Hypertension -Although Norvasc as mentioned on his med list, he states that he has not been taking this -Again, states he will discuss treatment with his PCP  Anxiety and depression - Continue Wellbutrin and BuSpar  Smoking -He states he smokes on occasion -I have offered to prescribe nicotine patches-he is using 7 mg patches in the hospital-he is declining and states he will get a prescription from his PCP    Discharge Exam: Filed Weights   02/27/15 1617  Weight: 72.6 kg (160 lb 0.9 oz)   Filed Vitals:   03/02/15 0507  BP: 157/99  Pulse: 97  Temp: 97.8 F (36.6 C)  Resp: 18    General: AAO x 3, no distress Cardiovascular: RRR, no murmurs  Respiratory: clear to auscultation bilaterally GI: soft, non-tender, non-distended, bowel sound positive  Discharge Instructions You were cared for by a hospitalist during your hospital stay. If you have any questions about your discharge medications or the care you received while you were in the hospital after you are discharged, you can call the unit and asked to speak with the hospitalist on call if the hospitalist that took care of you is not available. Once you are discharged, your primary care physician will handle any further medical issues. Please note that NO REFILLS for any discharge medications will be authorized once you are discharged, as it is imperative that you return to your primary care physician (or establish a relationship with a primary care physician if you do not have one) for your aftercare needs so that they can reassess your need for medications  and monitor your lab values.  Discharge Instructions    Discharge instructions    Complete by:  As directed   Diet: low fat, low sodium, diabetic diet     Increase activity slowly    Complete by:  As directed             Medication List    STOP taking these medications         multivitamin with minerals Tabs tablet- dates he was not taking this       TAKE these medications        acetaminophen 500 MG tablet  Commonly known as:  TYLENOL  Take 1,000 mg by mouth every 6 (six) hours as needed for moderate pain.     buPROPion 150 MG 24 hr tablet  Commonly known as:  WELLBUTRIN XL  Take 150 mg by mouth daily.     busPIRone 15 MG tablet  Commonly known as:  BUSPAR  Take 1 tablet (15 mg total) by mouth 2 (two) times daily.     cholecalciferol 1000 UNITS tablet  Commonly known as:  VITAMIN D  Take 1 tablet (1,000 Units total) by mouth daily.     ibuprofen 200 MG tablet  Commonly known as:  ADVIL,MOTRIN  Take 400 mg by mouth every 6 (six) hours as needed for moderate pain.     omeprazole 40 MG capsule  Commonly known as:  PRILOSEC  Take 1 capsule (40 mg total) by mouth daily.       No Known Allergies    The results of significant diagnostics from this hospitalization (including imaging, microbiology, ancillary and laboratory) are listed below for reference.    Significant Diagnostic Studies: Ct Abdomen Pelvis W Contrast  02/28/2015   CLINICAL DATA:  Epigastric abdominal pain since 02/23/2015. Nausea and vomiting. 29 lb weight loss over the past 6 months. Night sweats.  EXAM: CT ABDOMEN AND PELVIS WITH CONTRAST  TECHNIQUE: Multidetector CT imaging of the abdomen and pelvis was performed using the standard protocol following bolus administration of intravenous contrast.  CONTRAST:  156mL OMNIPAQUE IOHEXOL 300 MG/ML  SOLN  COMPARISON:  Abdomen ultrasound dated 02/27/2015, abdomen CT dated 01/19/2008 and abdomen and pelvis CT dated 03/11/2006.  FINDINGS: Diffuse low density of the liver relative to the spleen.  Interval decrease in size of the previously demonstrated large cystic area in the head of the pancreas. This measured 5.1 cm in maximum diameter on 01/19/2008 and currently measures 3.5 cm in maximum diameter. Coarse pancreatic calcifications are again  demonstrated. Progressive dilatation of the pancreatic duct is demonstrated with a maximum diameter of 9.4 mm on image number 29. There is also mild peripancreatic soft tissue stranding on the current images. No new fluid collections are seen.  Unremarkable spleen, gallbladder, adrenal glands, kidneys, ureters and urinary bladder. Minimally enlarged prostate gland containing some coarse calcifications.  No gastrointestinal abnormalities or enlarged lymph nodes. Minimal dependent atelectasis or scarring at both lung bases. Minimal bilateral hip degenerative changes. Lower thoracic spine degenerative changes. Minimal lumbar spine degenerative changes.  IMPRESSION: 1. Mild changes of acute pancreatitis. 2. Changes of chronic pancreatitis with progressive pancreatic ductal dilatation. 3. Interval decrease in size of a pancreatic head pseudocyst. 4. Diffuse hepatic steatosis.   Electronically Signed   By: Claudie Revering M.D.   On: 02/28/2015 12:33   US Abdomen Limited Ruq  02/27/2015   CLINICAL DATA:  Right upper quadrant abdominal pain. History of pancreatitis.  EXAM: US ABDOMEN LIMITED - RIGHT UPPER  QUADRANT  COMPARISON:  Previous ultrasound, CT and MR examinations in 2011.  FINDINGS: Gallbladder:  No gallstones or wall thickening visualized. No sonographic Murphy sign noted.  Common bile duct:  Diameter: 3.8 mm  Liver:  Diffusely echogenic.  IMPRESSION: 1. No acute abnormality sonographically evident in the right upper quadrant of the abdomen. 2. Diffusely echogenic liver, most likely due to interval steatosis.   Electronically Signed   By: Claudie Revering M.D.   On: 02/27/2015 15:49    Microbiology: No results found for this or any previous visit (from the past 240 hour(s)).   Labs: Basic Metabolic Panel:  Recent Labs Lab 02/27/15 1300 02/28/15 0414 03/01/15 0436 03/02/15 0512  NA 133*  --  133* 137  K 3.5  --  3.3* 3.8  CL 99*  --  99* 102  CO2 25  --  26 28  GLUCOSE 262*  --  130* 226*  BUN 6  --   6 9  CREATININE 0.69  --  0.59* 0.63  CALCIUM 9.0  --  8.4* 8.8*  MG  --  1.8  --   --    Liver Function Tests:  Recent Labs Lab 02/27/15 1300 02/28/15 0414 03/01/15 0436 03/02/15 0512  AST 40 35 43* 50*  ALT 37 33 35 42  ALKPHOS 121 103 103 116  BILITOT 0.9 1.2 0.7 0.2*  PROT 7.4 6.7 6.6 7.3  ALBUMIN 3.9 3.3* 3.3* 3.6    Recent Labs Lab 02/27/15 1300 02/28/15 0414 03/01/15 0436  LIPASE 196* 65* 48   No results for input(s): AMMONIA in the last 168 hours. CBC:  Recent Labs Lab 02/27/15 1300 03/01/15 0436 03/02/15 0512  WBC 10.4 5.3 4.5  NEUTROABS 8.5*  --   --   HGB 14.6 11.3* 11.3*  HCT 41.8 33.9* 34.6*  MCV 89.7 91.4 93.0  PLT 46* PLATELET CLUMPS NOTED ON SMEAR, UNABLE TO ESTIMATE PLATELET CLUMPS NOTED ON SMEAR, UNABLE TO ESTIMATE   Cardiac Enzymes: No results for input(s): CKTOTAL, CKMB, CKMBINDEX, TROPONINI in the last 168 hours. BNP: BNP (last 3 results) No results for input(s): BNP in the last 8760 hours.  ProBNP (last 3 results) No results for input(s): PROBNP in the last 8760 hours.  CBG:  Recent Labs Lab 03/01/15 0815 03/01/15 1153 03/01/15 1643 03/01/15 2141 03/02/15 0732  GLUCAP 230* 141* 186* 173* 194*       SignedDebbe Odea, MD [facility] 03/02/2015, 6:10 PM

## 2015-04-06 ENCOUNTER — Encounter: Payer: Self-pay | Admitting: Internal Medicine

## 2015-04-06 ENCOUNTER — Ambulatory Visit (INDEPENDENT_AMBULATORY_CARE_PROVIDER_SITE_OTHER): Payer: BLUE CROSS/BLUE SHIELD | Admitting: Internal Medicine

## 2015-04-06 VITALS — BP 130/90 | HR 82 | Wt 163.0 lb

## 2015-04-06 DIAGNOSIS — E119 Type 2 diabetes mellitus without complications: Secondary | ICD-10-CM | POA: Diagnosis not present

## 2015-04-06 DIAGNOSIS — F1010 Alcohol abuse, uncomplicated: Secondary | ICD-10-CM

## 2015-04-06 DIAGNOSIS — K860 Alcohol-induced chronic pancreatitis: Secondary | ICD-10-CM | POA: Diagnosis not present

## 2015-04-06 DIAGNOSIS — I10 Essential (primary) hypertension: Secondary | ICD-10-CM | POA: Diagnosis not present

## 2015-04-06 DIAGNOSIS — F4321 Adjustment disorder with depressed mood: Secondary | ICD-10-CM

## 2015-04-06 MED ORDER — PANCRELIPASE (LIP-PROT-AMYL) 36000-114000 UNITS PO CPEP: 36000.0000 [IU] | ORAL_CAPSULE | Freq: Three times a day (TID) | ORAL | Status: DC

## 2015-04-06 NOTE — Assessment & Plan Note (Signed)
Chronic  Losartan 

## 2015-04-06 NOTE — Progress Notes (Signed)
Pre visit review using our clinic review tool, if applicable. No additional management support is needed unless otherwise documented below in the visit note. 

## 2015-04-06 NOTE — Assessment & Plan Note (Addendum)
Due to ETOH Dry since 9/16 Creon

## 2015-04-06 NOTE — Assessment & Plan Note (Signed)
Discussed  Coping better 

## 2015-04-06 NOTE — Assessment & Plan Note (Signed)
On Metformin 

## 2015-04-06 NOTE — Assessment & Plan Note (Signed)
Dry since 9/16 AA Naltrexone

## 2015-04-06 NOTE — Progress Notes (Signed)
Subjective:  Patient ID: Luke White, male    DOB: [birth year]  Age: 54 y.o. MRN: [id number]  CC: No chief complaint on file.   HPI Luke White presents for chronic pancreatitis, depression, grief. Now in AA (dry x30 days); s/p IP/OP treatment in [place]. F/u DM - new. CBGs ok  Outpatient Prescriptions Prior to Visit  Medication Sig Dispense Refill  . acetaminophen (TYLENOL) 500 MG tablet Take 1,000 mg by mouth every 6 (six) hours as needed for moderate pain.    . busPIRone (BUSPAR) 15 MG tablet Take 1 tablet (15 mg total) by mouth 2 (two) times daily. 60 tablet 0  . cholecalciferol (VITAMIN D) 1000 UNITS tablet Take 1 tablet (1,000 Units total) by mouth daily.    [name] ibuprofen (ADVIL,MOTRIN) 200 MG tablet Take 400 mg by mouth every 6 (six) hours as needed for moderate pain.    [name] omeprazole (PRILOSEC) 40 MG capsule Take 1 capsule (40 mg total) by mouth daily. (Patient taking differently: Take 20 mg by mouth daily. ) 90 capsule 2  . buPROPion (WELLBUTRIN XL) 150 MG 24 hr tablet Take 150 mg by mouth daily.     No facility-administered medications prior to visit.    ROS Review of Systems  Constitutional: Negative for appetite change, fatigue and unexpected weight change.  HENT: Negative for congestion, nosebleeds, sneezing, sore throat and trouble swallowing.   Eyes: Negative for itching and visual disturbance.  Respiratory: Negative for cough.   Cardiovascular: Negative for chest pain, palpitations and leg swelling.  Gastrointestinal: Negative for nausea, diarrhea, blood in stool and abdominal distention.  Genitourinary: Negative for frequency and hematuria.  Musculoskeletal: Negative for back pain, joint swelling, gait problem and neck pain.  Skin: Negative for rash.  Neurological: Negative for dizziness, tremors, speech difficulty and weakness.  Psychiatric/Behavioral: Positive for dysphoric mood and decreased concentration. Negative for suicidal ideas, sleep disturbance,  self-injury and agitation. The patient is nervous/anxious.     Objective:  BP 130/90 mmHg  Pulse 82  Wt 163 lb (73.936 kg)  SpO2 98%  BP Readings from Last 3 Encounters:  04/06/15 130/90  03/02/15 157/99  12/12/14 148/97    Wt Readings from Last 3 Encounters:  04/06/15 163 lb (73.936 kg)  02/27/15 160 lb 0.9 oz (72.6 kg)  12/10/14 164 lb (74.39 kg)    Physical Exam  Constitutional: He is oriented to person, place, and time. He appears well-developed. No distress.  NAD  HENT:  Mouth/Throat: Oropharynx is clear and moist.  Eyes: Conjunctivae are normal. Pupils are equal, round, and reactive to light.  Neck: Normal range of motion. No JVD present. No thyromegaly present.  Cardiovascular: Normal rate, regular rhythm, normal heart sounds and intact distal pulses.  Exam reveals no gallop and no friction rub.   No murmur heard. Pulmonary/Chest: Effort normal and breath sounds normal. No respiratory distress. He has no wheezes. He has no rales. He exhibits no tenderness.  Abdominal: Soft. Bowel sounds are normal. He exhibits no distension and no mass. There is no tenderness. There is no rebound and no guarding.  Musculoskeletal: Normal range of motion. He exhibits no edema or tenderness.  Lymphadenopathy:    He has no cervical adenopathy.  Neurological: He is alert and oriented to person, place, and time. He has normal reflexes. No cranial nerve deficit. He exhibits normal muscle tone. He displays a negative Romberg sign. Coordination and gait normal.  Skin: Skin is warm and dry. No rash noted.  Psychiatric: He  has a normal mood and affect. His behavior is normal. Judgment and thought content normal.    Lab Results  Component Value Date   WBC 4.5 03/02/2015   HGB 11.3* 03/02/2015   HCT 34.6* 03/02/2015   PLT PLATELET CLUMPS NOTED ON SMEAR, UNABLE TO ESTIMATE 03/02/2015   GLUCOSE 226* 03/02/2015   CHOL 213* 03/15/2014   TRIG 129.0 03/15/2014   HDL 53.60 03/15/2014   LDLDIRECT  149.4 01/12/2011   LDLCALC 134* 03/15/2014   ALT 42 03/02/2015   AST 50* 03/02/2015   NA 137 03/02/2015   K 3.8 03/02/2015   CL 102 03/02/2015   CREATININE 0.63 03/02/2015   BUN 9 03/02/2015   CO2 28 03/02/2015   TSH 2.21 03/15/2014   PSA 0.41 03/15/2014   HGBA1C 7.5* 02/27/2015    Ct Abdomen Pelvis W Contrast  02/28/2015  CLINICAL DATA:  Epigastric abdominal pain since 02/23/2015. Nausea and vomiting. 29 lb weight loss over the past 6 months. Night sweats. EXAM: CT ABDOMEN AND PELVIS WITH CONTRAST TECHNIQUE: Multidetector CT imaging of the abdomen and pelvis was performed using the standard protocol following bolus administration of intravenous contrast. CONTRAST:  150mL OMNIPAQUE IOHEXOL 300 MG/ML  SOLN COMPARISON:  Abdomen ultrasound dated 02/27/2015, abdomen CT dated 01/19/2008 and abdomen and pelvis CT dated 03/11/2006. FINDINGS: Diffuse low density of the liver relative to the spleen. Interval decrease in size of the previously demonstrated large cystic area in the head of the pancreas. This measured 5.1 cm in maximum diameter on 01/19/2008 and currently measures 3.5 cm in maximum diameter. Coarse pancreatic calcifications are again demonstrated. Progressive dilatation of the pancreatic duct is demonstrated with a maximum diameter of 9.4 mm on image number 29. There is also mild peripancreatic soft tissue stranding on the current images. No new fluid collections are seen. Unremarkable spleen, gallbladder, adrenal glands, kidneys, ureters and urinary bladder. Minimally enlarged prostate gland containing some coarse calcifications. No gastrointestinal abnormalities or enlarged lymph nodes. Minimal dependent atelectasis or scarring at both lung bases. Minimal bilateral hip degenerative changes. Lower thoracic spine degenerative changes. Minimal lumbar spine degenerative changes. IMPRESSION: 1. Mild changes of acute pancreatitis. 2. Changes of chronic pancreatitis with progressive pancreatic ductal  dilatation. 3. Interval decrease in size of a pancreatic head pseudocyst. 4. Diffuse hepatic steatosis. Electronically Signed   By: Claudie Revering M.D.   On: 02/28/2015 12:33   US Abdomen Limited Ruq  02/27/2015  CLINICAL DATA:  Right upper quadrant abdominal pain. History of pancreatitis. EXAM: US ABDOMEN LIMITED - RIGHT UPPER QUADRANT COMPARISON:  Previous ultrasound, CT and MR examinations in 2011. FINDINGS: Gallbladder: No gallstones or wall thickening visualized. No sonographic Murphy sign noted. Common bile duct: Diameter: 3.8 mm Liver: Diffusely echogenic. IMPRESSION: 1. No acute abnormality sonographically evident in the right upper quadrant of the abdomen. 2. Diffusely echogenic liver, most likely due to interval steatosis. Electronically Signed   By: Claudie Revering M.D.   On: 02/27/2015 15:49    Assessment & Plan:   Diagnoses and all orders for this visit:  Type 2 diabetes mellitus without complication, without long-term current use of insulin (HCC)  Alcohol-induced chronic pancreatitis ([place])  Essential hypertension  Other orders -     lipase/protease/amylase (CREON) 36000 UNITS CPEP capsule; Take 1 capsule (36,000 Units total) by mouth 3 (three) times daily before meals.  I am having Mr. Hird start on lipase/protease/amylase. I am also having him maintain his busPIRone, cholecalciferol, omeprazole, acetaminophen, ibuprofen, buPROPion, BAYER CONTOUR TEST, hydrOXYzine, MICROLET LANCETS, losartan,  metFORMIN, naltrexone, and traZODone.  Meds ordered this encounter  Medications  . buPROPion (WELLBUTRIN SR) 150 MG 12 hr tablet    Sig: Take 1 tablet by mouth daily.    Refill:  0  . BAYER CONTOUR TEST test strip    Sig: 2 (two) times daily.    Refill:  6  . hydrOXYzine (ATARAX/VISTARIL) 50 MG tablet    Sig: Take 1-2 tablets by mouth 3 (three) times daily as needed.    Refill:  0  . MICROLET LANCETS MISC    Sig: 2 (two) times daily.    Refill:  6  . losartan (COZAAR) 50 MG tablet     Sig: Take 1 tablet by mouth every morning.    Refill:  3  . metFORMIN (GLUCOPHAGE) 500 MG tablet    Sig: Take 1 tablet by mouth 2 (two) times daily before a meal.    Refill:  2  . naltrexone (DEPADE) 50 MG tablet    Sig: Take 1 tablet by mouth every morning.    Refill:  0  . traZODone (DESYREL) 50 MG tablet    Sig: Take 1 tablet by mouth at bedtime.    Refill:  0  . lipase/protease/amylase (CREON) 36000 UNITS CPEP capsule    Sig: Take 1 capsule (36,000 Units total) by mouth 3 (three) times daily before meals.    Dispense:  90 capsule    Refill:  3     Follow-up: Return in about 3 months (around 07/07/2015) for a follow-up visit.  [name], MD

## 2015-04-08 ENCOUNTER — Other Ambulatory Visit: Payer: Self-pay | Admitting: Internal Medicine

## 2015-04-08 ENCOUNTER — Telehealth: Payer: Self-pay | Admitting: *Deleted

## 2015-04-08 MED ORDER — GLUCOSE BLOOD VI STRP
1.0000 | ORAL_STRIP | Freq: Two times a day (BID) | Status: DC
Start: 2015-04-08 — End: 2016-05-02

## 2015-04-08 NOTE — Telephone Encounter (Signed)
Left msg on triage yesterday afternoon stating md sent in wrong BS strips. He uses Contour Next. Needing strips for contour next the original contour strips will not work meter. Called pt this am verified which pharmacy to send updated strips. Inform pt will send [facility]...Luke White

## 2015-04-21 ENCOUNTER — Encounter: Payer: Self-pay | Admitting: Internal Medicine

## 2015-04-21 ENCOUNTER — Ambulatory Visit (INDEPENDENT_AMBULATORY_CARE_PROVIDER_SITE_OTHER): Payer: BLUE CROSS/BLUE SHIELD | Admitting: Internal Medicine

## 2015-04-21 ENCOUNTER — Other Ambulatory Visit (INDEPENDENT_AMBULATORY_CARE_PROVIDER_SITE_OTHER): Payer: BLUE CROSS/BLUE SHIELD

## 2015-04-21 VITALS — BP 120/80 | HR 76 | Ht 71.0 in | Wt 160.6 lb

## 2015-04-21 DIAGNOSIS — Z1211 Encounter for screening for malignant neoplasm of colon: Secondary | ICD-10-CM | POA: Diagnosis not present

## 2015-04-21 DIAGNOSIS — R7989 Other specified abnormal findings of blood chemistry: Secondary | ICD-10-CM | POA: Diagnosis not present

## 2015-04-21 DIAGNOSIS — K860 Alcohol-induced chronic pancreatitis: Secondary | ICD-10-CM | POA: Diagnosis not present

## 2015-04-21 DIAGNOSIS — K858 Other acute pancreatitis without necrosis or infection: Secondary | ICD-10-CM

## 2015-04-21 DIAGNOSIS — R945 Abnormal results of liver function studies: Secondary | ICD-10-CM

## 2015-04-21 DIAGNOSIS — R1013 Epigastric pain: Secondary | ICD-10-CM

## 2015-04-21 LAB — CBC WITH DIFFERENTIAL/PLATELET
BASOS PCT: 0.5 % (ref 0.0–3.0)
Eosinophils Relative: 1.2 % (ref 0.0–5.0)
HEMATOCRIT: 39.2 % (ref 39.0–52.0)
Hemoglobin: 13 g/dL (ref 13.0–17.0)
LYMPHS PCT: 20.2 % (ref 12.0–46.0)
MCHC: 33.1 g/dL (ref 30.0–36.0)
MCV: 87.9 fl (ref 78.0–100.0)
Monocytes Relative: 6.7 % (ref 3.0–12.0)
NEUTROS PCT: 71.4 % (ref 43.0–77.0)
PLATELETS: 206.8 10*3/uL (ref 150.0–400.0)
RBC: 4.46 Mil/uL (ref 4.22–5.81)
RDW: 12.8 % (ref 11.5–15.5)
WBC: 7.7 10*3/uL (ref 4.0–10.5)

## 2015-04-21 LAB — LIPASE: Lipase: 131 U/L — ABNORMAL HIGH (ref 11.0–59.0)

## 2015-04-21 LAB — COMPREHENSIVE METABOLIC PANEL
ALT: 15 U/L (ref 0–53)
AST: 20 U/L (ref 0–37)
Albumin: 4.4 g/dL (ref 3.5–5.2)
Alkaline Phosphatase: 99 U/L (ref 39–117)
BILIRUBIN TOTAL: 0.3 mg/dL (ref 0.2–1.2)
BUN: 14 mg/dL (ref 6–23)
CALCIUM: 10.1 mg/dL (ref 8.4–10.5)
CHLORIDE: 102 meq/L (ref 96–112)
CO2: 29 meq/L (ref 19–32)
Creatinine, Ser: 0.79 mg/dL (ref 0.40–1.50)
GFR: 108.55 mL/min (ref >60.00)
GLUCOSE: 109 mg/dL — AB (ref 70–99)
POTASSIUM: 4.4 meq/L (ref 3.5–5.1)
Sodium: 138 mEq/L (ref 135–145)
Total Protein: 7.7 g/dL (ref 6.0–8.3)

## 2015-04-21 LAB — PROTIME-INR
INR: 1 ratio (ref 0.8–1.0)
Prothrombin Time: 11.3 s (ref 9.6–13.1)

## 2015-04-21 MED ORDER — NA SULFATE-K SULFATE-MG SULF 17.5-3.13-1.6 GM/177ML PO SOLN: 1.0000 | Freq: Once | ORAL | Status: DC

## 2015-04-21 NOTE — Patient Instructions (Signed)
Your physician has requested that you go to the basement for lab work before leaving today.  You have been scheduled for a colonoscopy. Please follow written instructions given to you at your visit today.  Please pick up your prep supplies at the pharmacy within the next 1-3 days. If you use inhalers (even only as needed), please bring them with you on the day of your procedure. Your physician has requested that you go to www.startemmi.com and enter the access code given to you at your visit today. This web site gives a general overview about your procedure. However, you should still follow specific instructions given to you by our office regarding your preparation for the procedure.   

## 2015-04-21 NOTE — Progress Notes (Signed)
HISTORY OF PRESENT ILLNESS:  Luke White is a 54 y.o. male with a history of chronic alcoholism who presents today for follow-up regarding management of chronic pancreatitis, evaluation of abnormal liver tests, and the need for screening colonoscopy. The patient was hospitalized in early September with acute pancreatitis. He had been drinking alcohol substantially. He had a known history of alcoholic pancreatitis with chronic changes including calcification, ductal dilation, and pseudocyst formation. Initial CT scan showed mild inflammation, ductal dilation which was progressive, calcifications, and a 3 cm pseudocyst. He also had abnormal liver tests in an alcohol pattern. Depressed platelets. CT scan did show fatty liver. He was also found to have new onset diabetes mellitus. After his hospital discharge, he was taken by his family to [place] and entered in a 28 day alcohol rehabilitation program which he successfully completed. He has returned [place]. He has been abstinent from alcohol for 46 days. He recently saw his primary care provider Dr. Alain White. Currently taking metformin 500 mg twice daily for his diabetes. Morning blood sugars have been in the 107-122 range. Evening postprandial blood sugars have been in the 1:30 to 150 range. Patient has had some mild postprandial upper abdominal discomfort over the past week. On 3 occasions after his evening meal. Discomfort lasted most of the evening and was treated with ibuprofen. No nausea or vomiting. She does report increased urinary frequency. No additional GI complaints. He does request screening colonoscopy as he has not had such previously. Dr. Alain White did prescribe Creon with meals for his upper abdominal discomfort. The patient denies a history of steatorrhea. He is back on his antidepressant medications which she had abruptly stopped previously. As well, naltrexone. No blood work since his hospital discharge in September  REVIEW OF  SYSTEMS:  All non-GI ROS negative except for increased urinary frequency, anxiety, depression  Past Medical History  Diagnosis Date  . Anxiety   . GERD (gastroesophageal reflux disease)   . Allergic rhinitis   . Pancreatitis 2008    HX of  x2 ? cause  . Hypertension   . Depression   . Diabetes ([place])   . Hepatic steatosis     History reviewed. No pertinent past surgical history.  Social History Luke White  reports that he has quit smoking. His smoking use included Cigarettes. He has a 6.75 pack-year smoking history. He has never used smokeless tobacco. He reports that he does not drink alcohol or use illicit drugs.  family history includes Anxiety disorder in his other; COPD in his father; Diabetes in his maternal grandmother and maternal uncle; Hyperlipidemia in his father; Hypertension in his father and mother. There is no history of Heart disease, Colon cancer, Esophageal cancer, Pancreatic cancer, Stomach cancer, Kidney disease, or Liver disease.  No Known Allergies     PHYSICAL EXAMINATION: Vital signs: BP 120/80 mmHg  Pulse 76  Ht 5\' 11"  (1.803 m)  Wt 160 lb 9.6 oz (72.848 kg)  BMI 22.41 kg/m2  Constitutional: generally well-appearing, no acute distress Psychiatric: alert and oriented x3, cooperative Eyes: extraocular movements intact, anicteric, conjunctiva pink Mouth: oral pharynx moist, no lesions Neck: supple without thyromegaly; no lymphadenopathy Cardiovascular: heart regular rate and rhythm, no murmur Lungs: clear to auscultation bilaterally Abdomen: soft, mild tenderness in the upper midabdomen without mass, nondistended, no obvious ascites, no peritoneal signs, normal bowel sounds, no organomegaly Rectal: Deferred until colonoscopy Extremities: no clubbing cyanosis or lower extremity edema bilaterally Skin: no lesions on visible extremities Neuro: No focal deficits.  No asterixis.  ASSESSMENT:  #1. Alcoholic pancreatitis. Acute on chronic. Stable at  this time post hospitalization with some postprandial upper abdominal discomfort #2. Abnormal liver tests. Alcohol-related #3. Fatty liver on CT. Alcohol-related #4. Chronic alcoholism. Currently abstinent for 46 days post inpatient rehabilitation #5. Colon cancer screening. Baseline risk. Appropriate candidate without contraindication  PLAN:  #1. Continue to avoid all alcohol #2. Continue Creon with meals for now. The data is variable regarding benefit, but worth trying at this point #3. Laboratories today including CBC, comprehensive metabolic panel, lipase, PT/INR #4. Screening colonoscopy.The nature of the procedure, as well as the risks, benefits, and alternatives were carefully and thoroughly reviewed with the patient. Ample time for discussion and questions allowed. The patient understood, was satisfied, and agreed to proceed.

## 2015-04-25 ENCOUNTER — Other Ambulatory Visit: Payer: Self-pay | Admitting: Internal Medicine

## 2015-04-28 ENCOUNTER — Telehealth: Payer: Self-pay | Admitting: *Deleted

## 2015-04-28 MED ORDER — TRAZODONE HCL 50 MG PO TABS: 50.0000 mg | ORAL_TABLET | Freq: Every day | ORAL | Status: DC

## 2015-04-28 MED ORDER — NALTREXONE HCL 50 MG PO TABS: 50.0000 mg | ORAL_TABLET | Freq: Every morning | ORAL | Status: DC

## 2015-04-28 NOTE — Telephone Encounter (Signed)
Left msg on triage stating md was suppose to send refills on his Naltrexone & trazodone pharmacy states they never received script. Notified pt md had entered med but they was not sent. [facility]....Luke White

## 2015-04-29 ENCOUNTER — Encounter: Payer: Self-pay | Admitting: Internal Medicine

## 2015-04-29 ENCOUNTER — Ambulatory Visit (AMBULATORY_SURGERY_CENTER): Payer: BLUE CROSS/BLUE SHIELD | Admitting: Internal Medicine

## 2015-04-29 VITALS — BP 126/89 | HR 75 | Temp 97.6°F | Resp 52 | Ht 71.0 in | Wt 160.0 lb

## 2015-04-29 DIAGNOSIS — Z1211 Encounter for screening for malignant neoplasm of colon: Secondary | ICD-10-CM

## 2015-04-29 LAB — GLUCOSE, CAPILLARY
GLUCOSE-CAPILLARY: 120 mg/dL — AB (ref 65–99)
Glucose-Capillary: 135 mg/dL — ABNORMAL HIGH (ref 65–99)

## 2015-04-29 MED ORDER — SODIUM CHLORIDE 0.9 % IV SOLN: 500.0000 mL | INTRAVENOUS | Status: DC

## 2015-04-29 NOTE — Patient Instructions (Signed)
YOU HAD AN ENDOSCOPIC PROCEDURE TODAY AT [facility]:   Refer to the procedure report that was given to you for any specific questions about what was found during the examination.  If the procedure report does not answer your questions, please call your gastroenterologist to clarify.  If you requested that your care partner not be given the details of your procedure findings, then the procedure report has been included in a sealed envelope for you to review at your convenience later.  YOU SHOULD EXPECT: Some feelings of bloating in the abdomen. Passage of more gas than usual.  Walking can help get rid of the air that was put into your GI tract during the procedure and reduce the bloating. If you had a lower endoscopy (such as a colonoscopy or flexible sigmoidoscopy) you may notice spotting of blood in your stool or on the toilet paper. If you underwent a bowel prep for your procedure, you may not have a normal bowel movement for a few days.  Please Note:  You might notice some irritation and congestion in your nose or some drainage.  This is from the oxygen used during your procedure.  There is no need for concern and it should clear up in a day or so.  SYMPTOMS TO REPORT IMMEDIATELY:   Following lower endoscopy (colonoscopy or flexible sigmoidoscopy):  Excessive amounts of blood in the stool  Significant tenderness or worsening of abdominal pains  Swelling of the abdomen that is new, acute  Fever of 100F or higher   For urgent or emergent issues, a gastroenterologist can be reached at any hour by calling [phone].   DIET: Your first meal following the procedure should be a small meal and then it is ok to progress to your normal diet. Heavy or fried foods are harder to digest and may make you feel nauseous or bloated.  Likewise, meals heavy in dairy and vegetables can increase bloating.  Drink plenty of fluids but you should avoid alcoholic beverages for 24  hours.  ACTIVITY:  You should plan to take it easy for the rest of today and you should NOT DRIVE or use heavy machinery until tomorrow (because of the sedation medicines used during the test).    FOLLOW UP: Our staff will call the number listed on your records the next business day following your procedure to check on you and address any questions or concerns that you may have regarding the information given to you following your procedure. If we do not reach you, we will leave a message.  However, if you are feeling well and you are not experiencing any problems, there is no need to return our call.  We will assume that you have returned to your regular daily activities without incident.  If any biopsies were taken you will be contacted by phone or by letter within the next 1-3 weeks.  Please call us at [phone] if you have not heard about the biopsies in 3 weeks.    SIGNATURES/CONFIDENTIALITY: You and/or your care partner have signed paperwork which will be entered into your electronic medical record.  These signatures attest to the fact that that the information above on your After Visit Summary has been reviewed and is understood.  Full responsibility of the confidentiality of this discharge information lies with you and/or your care-partner.  Diverticulosis and high fiber diet information given.  Follow up appointment with Dr. Henrene Pastor in office -3 months.

## 2015-04-29 NOTE — Progress Notes (Signed)
Report to PACU, RN, vss, BBS= Clear.  

## 2015-04-29 NOTE — Op Note (Signed)
[facility]  [address] [place], [id number]   COLONOSCOPY PROCEDURE REPORT  PATIENT: Luke White, Luke White  MR#: [id number] BIRTHDATE: [birth year] , 69  yrs. old GENDER: male ENDOSCOPIST: Eustace Quail, MD REFERRED BY:.  Self / Office PROCEDURE DATE:  04/29/2015 PROCEDURE:   Colonoscopy, screening First Screening Colonoscopy - Avg.  risk and is 50 yrs.  old or older Yes.  Prior Negative Screening - Now for repeat screening. N/A  History of Adenoma - Now for follow-up colonoscopy & has been > or = to 3 yrs.  N/A  Polyps removed today? No Polyps removed today? No Recommend repeat exam, <10 yrs? No ASA CLASS:   Class II INDICATIONS:Screening for colonic neoplasia and Colorectal Neoplasm Risk Assessment for this procedure is average risk. MEDICATIONS: Monitored anesthesia care and Propofol 300 mg IV  , lidocaine 140 mg IV  DESCRIPTION OF PROCEDURE:   After the risks benefits and alternatives of the procedure were thoroughly explained, informed consent was obtained.  The digital rectal exam revealed no abnormalities of the rectum.   The [id number]  endoscope was introduced through the anus and advanced to the cecum, which was identified by both the appendix and ileocecal valve. No adverse events experienced.   The quality of the prep was excellent. (Suprep was used)  The instrument was then slowly withdrawn as the colon was fully examined. Estimated blood loss is zero unless otherwise noted in this procedure report.     COLON FINDINGS: There was moderate diverticulosis noted in the left colon.   The examination was otherwise normal.  Retroflexed views revealed no abnormalities. The time to cecum = 2.8 Withdrawal time = 10.2   The scope was withdrawn and the procedure completed. COMPLICATIONS: There were no immediate complications.  ENDOSCOPIC IMPRESSION: 1.   Moderate diverticulosis was noted in the left colon 2.   The examination was otherwise  normal  RECOMMENDATIONS: 1. Continue current colorectal screening recommendations for "routine risk" patients with a repeat colonoscopy in 10 years. 2. Routine office follow-up with Dr. Henrene Pastor in about 3 months  eSigned:  Eustace Quail, MD 04/29/2015 8:04 AM   cc: The Patient and [place].  [place], MD

## 2015-05-02 ENCOUNTER — Telehealth: Payer: Self-pay | Admitting: *Deleted

## 2015-05-02 NOTE — Telephone Encounter (Signed)
  Follow up Call-  Call back number 04/29/2015  Post procedure Call Back phone  # [phone]  Permission to leave phone message Yes     Patient questions:  Do you have a fever, pain , or abdominal swelling? No. Pain Score  0 *  Have you tolerated food without any problems? Yes.    Have you been able to return to your normal activities? Yes.    Do you have any questions about your discharge instructions: Diet   No. Medications  No. Follow up visit No   Do you have questions or concerns about your Care? No.  Actions: * If pain score is 4 or above: No action needed, pain <4.

## 2015-06-20 ENCOUNTER — Other Ambulatory Visit: Payer: Self-pay | Admitting: Internal Medicine

## 2015-07-02 ENCOUNTER — Emergency Department (HOSPITAL_COMMUNITY)
Admission: EM | Admit: 2015-07-02 | Discharge: 2015-07-02 | Disposition: A | Discharge status: Home / Self Care | Payer: BLUE CROSS/BLUE SHIELD | Attending: Emergency Medicine | Admitting: Emergency Medicine

## 2015-07-02 ENCOUNTER — Emergency Department (HOSPITAL_COMMUNITY): Payer: BLUE CROSS/BLUE SHIELD

## 2015-07-02 ENCOUNTER — Encounter (HOSPITAL_COMMUNITY): Payer: Self-pay | Admitting: *Deleted

## 2015-07-02 DIAGNOSIS — Y9289 Other specified places as the place of occurrence of the external cause: Secondary | ICD-10-CM | POA: Insufficient documentation

## 2015-07-02 DIAGNOSIS — Z7984 Long term (current) use of oral hypoglycemic drugs: Secondary | ICD-10-CM | POA: Diagnosis not present

## 2015-07-02 DIAGNOSIS — Z8709 Personal history of other diseases of the respiratory system: Secondary | ICD-10-CM | POA: Diagnosis not present

## 2015-07-02 DIAGNOSIS — Z79899 Other long term (current) drug therapy: Secondary | ICD-10-CM | POA: Insufficient documentation

## 2015-07-02 DIAGNOSIS — Y9389 Activity, other specified: Secondary | ICD-10-CM | POA: Insufficient documentation

## 2015-07-02 DIAGNOSIS — W010XXA Fall on same level from slipping, tripping and stumbling without subsequent striking against object, initial encounter: Secondary | ICD-10-CM | POA: Diagnosis not present

## 2015-07-02 DIAGNOSIS — E119 Type 2 diabetes mellitus without complications: Secondary | ICD-10-CM | POA: Diagnosis not present

## 2015-07-02 DIAGNOSIS — Z79891 Long term (current) use of opiate analgesic: Secondary | ICD-10-CM | POA: Insufficient documentation

## 2015-07-02 DIAGNOSIS — Y998 Other external cause status: Secondary | ICD-10-CM | POA: Insufficient documentation

## 2015-07-02 DIAGNOSIS — F419 Anxiety disorder, unspecified: Secondary | ICD-10-CM | POA: Diagnosis not present

## 2015-07-02 DIAGNOSIS — Z87891 Personal history of nicotine dependence: Secondary | ICD-10-CM | POA: Insufficient documentation

## 2015-07-02 DIAGNOSIS — S52352A Displaced comminuted fracture of shaft of radius, left arm, initial encounter for closed fracture: Secondary | ICD-10-CM | POA: Diagnosis not present

## 2015-07-02 DIAGNOSIS — S52502A Unspecified fracture of the lower end of left radius, initial encounter for closed fracture: Secondary | ICD-10-CM

## 2015-07-02 DIAGNOSIS — F329 Major depressive disorder, single episode, unspecified: Secondary | ICD-10-CM | POA: Diagnosis not present

## 2015-07-02 DIAGNOSIS — I10 Essential (primary) hypertension: Secondary | ICD-10-CM | POA: Insufficient documentation

## 2015-07-02 DIAGNOSIS — S6992XA Unspecified injury of left wrist, hand and finger(s), initial encounter: Secondary | ICD-10-CM | POA: Diagnosis present

## 2015-07-02 DIAGNOSIS — K219 Gastro-esophageal reflux disease without esophagitis: Secondary | ICD-10-CM | POA: Diagnosis not present

## 2015-07-02 MED ORDER — LIDOCAINE HCL 2 % IJ SOLN
20.0000 mL | Freq: Once | INTRAMUSCULAR | Status: AC
Start: 2015-07-02 — End: 2015-07-02
  Administered 2015-07-02: 400 mg via INTRADERMAL
  Filled 2015-07-02: qty 20

## 2015-07-02 MED ORDER — HYDROMORPHONE HCL 1 MG/ML IJ SOLN
1.0000 mg | Freq: Once | INTRAMUSCULAR | Status: AC
  Administered 2015-07-02: 1 mg via INTRAVENOUS
  Filled 2015-07-02: qty 1

## 2015-07-02 MED ORDER — HYDROMORPHONE HCL 1 MG/ML IJ SOLN
2.0000 mg | Freq: Once | INTRAMUSCULAR | Status: AC
  Administered 2015-07-02: 2 mg via INTRAVENOUS
  Filled 2015-07-02: qty 2

## 2015-07-02 MED ORDER — ONDANSETRON HCL 4 MG/2ML IJ SOLN: 4.0000 mg | Freq: Once | INTRAMUSCULAR | Status: DC

## 2015-07-02 MED ORDER — LIDOCAINE HCL 2 % IJ SOLN
20.0000 mL | Freq: Once | INTRAMUSCULAR | Status: AC
  Administered 2015-07-02: 400 mg via INTRADERMAL
  Filled 2015-07-02: qty 20

## 2015-07-02 MED ORDER — HYDROMORPHONE HCL 1 MG/ML IJ SOLN
1.0000 mg | Freq: Once | INTRAMUSCULAR | Status: AC
Start: 2015-07-02 — End: 2015-07-02
  Administered 2015-07-02: 1 mg via INTRAVENOUS
  Filled 2015-07-02: qty 1

## 2015-07-02 MED ORDER — ONDANSETRON HCL 4 MG/2ML IJ SOLN
INTRAMUSCULAR | Status: AC
  Filled 2015-07-02: qty 2

## 2015-07-02 MED ORDER — TRAMADOL HCL 50 MG PO TABS: 50.0000 mg | ORAL_TABLET | Freq: Four times a day (QID) | ORAL | Status: DC | PRN

## 2015-07-02 MED ORDER — FENTANYL CITRATE (PF) 100 MCG/2ML IJ SOLN
50.0000 ug | Freq: Once | INTRAMUSCULAR | Status: AC
  Administered 2015-07-02: 50 ug via NASAL
  Filled 2015-07-02: qty 2

## 2015-07-02 NOTE — ED Notes (Signed)
Ortho at bedside.

## 2015-07-02 NOTE — ED Notes (Signed)
Pt is in X-Ray. 

## 2015-07-02 NOTE — Consult Note (Addendum)
ORTHOPAEDIC CONSULTATION HISTORY & PHYSICAL REQUESTING PHYSICIAN: Deno Etienne, DO  Chief Complaint: Left wrist injury  HPI: Luke White is a 55 y.o. male who fell onto an outstretched hand today in the snowy weather. He presented to emergency department with a closed injury, with pain, swelling, deformity of the left distal forearm. X-rays revealed a markedly dorsally angulated and displaced distal radius fracture. The ED providers instilled a hematoma block and attempted a closed reduction.  This was unsuccessful and yielding a result sufficient for discharge.  Past Medical History  Diagnosis Date  . Anxiety   . GERD (gastroesophageal reflux disease)   . Allergic rhinitis   . Pancreatitis 2008    HX of  x2 ? cause  . Hypertension   . Depression   . Diabetes ([place])   . Hepatic steatosis    Past Surgical History  Procedure Laterality Date  . Esophagogastroduodenoscopy endoscopy N/A 2012    Dr. Ardis Hughs   Social History   Social History  . Marital Status: Widowed    Spouse Name: N/A  . Number of Children: 2  . Years of Education: N/A   Occupational History  . STYLIST    Social History Main Topics  . Smoking status: Former Smoker -- 0.25 packs/day for 27 years    Types: Cigarettes  . Smokeless tobacco: Never Used  . Alcohol Use: No     Comment: stopped 46 days ago as of 04/21/15  . Drug Use: No  . Sexual Activity: Yes    Birth Control/ Protection: Condom     Comment: permanent partner   Other Topics Concern  . None   Social History Narrative   Domestic partner Legrand Como   Regular exercise-yes   Family History  Problem Relation Age of Onset  . Anxiety disorder Other   . Heart disease Neg Hx   . Hypertension Mother   . Hypertension Father   . Hyperlipidemia Father   . COPD Father   . Colon cancer Neg Hx   . Esophageal cancer Neg Hx   . Pancreatic cancer Neg Hx   . Stomach cancer Neg Hx   . Diabetes Maternal Uncle     x 3  . Diabetes Maternal Grandmother    . Kidney disease Neg Hx   . Liver disease Neg Hx    No Known Allergies Prior to Admission medications   Medication Sig Start Date End Date Taking? Authorizing Provider  acetaminophen (TYLENOL) 500 MG tablet Take 1,000 mg by mouth every 6 (six) hours as needed for moderate pain.   Yes Historical Provider, MD  buPROPion (WELLBUTRIN XL) 150 MG 24 hr tablet Take 150 mg by mouth daily.   Yes Historical Provider, MD  busPIRone (BUSPAR) 15 MG tablet TAKE 1 TABLET BY MOUTH TWICE DAILY 06/21/15  Yes Aleksei Plotnikov V, MD  cholecalciferol (VITAMIN D) 1000 UNITS tablet Take 1 tablet (1,000 Units total) by mouth daily. 12/12/14  Yes Niel Hummer, NP  hydrOXYzine (ATARAX/VISTARIL) 50 MG tablet Take 1-2 tablets by mouth 3 (three) times daily as needed. 03/07/15  Yes Historical Provider, MD  ibuprofen (ADVIL,MOTRIN) 200 MG tablet Take 400 mg by mouth every 6 (six) hours as needed for moderate pain.   Yes Historical Provider, MD  lipase/protease/amylase (CREON) 36000 UNITS CPEP capsule Take 1 capsule (36,000 Units total) by mouth 3 (three) times daily before meals. Patient taking differently: Take 36,000 Units by mouth 2 (two) times daily with a meal.  04/06/15  Yes Aleksei Plotnikov V,  MD  metFORMIN (GLUCOPHAGE) 500 MG tablet Take 1 tablet by mouth 2 (two) times daily before a meal. 04/01/15  Yes Historical Provider, MD  naltrexone (DEPADE) 50 MG tablet Take 1 tablet (50 mg total) by mouth every morning. 04/28/15  Yes Aleksei Plotnikov V, MD  omeprazole (PRILOSEC) 40 MG capsule TAKE ONE CAPSULE BY MOUTH DAILY 04/08/15  Yes Aleksei Plotnikov V, MD  traZODone (DESYREL) 50 MG tablet Take 1 tablet (50 mg total) by mouth at bedtime. 04/28/15  Yes Aleksei Plotnikov V, MD  glucose blood (BAYER CONTOUR NEXT TEST) test strip 1 each by Other route 2 (two) times daily. Use to check blood sugars twice a day Dx E11.9 04/08/15   Aleksei Plotnikov V, MD  traMADol (ULTRAM) 50 MG tablet Take 1-2 tablets (50-100 mg total) by  mouth every 6 (six) hours as needed for severe pain (Max 8 tablets daily). 07/02/15   [name], MD   Dg Wrist Complete Left  07/02/2015  CLINICAL DATA:  Status post reduction EXAM: LEFT WRIST - COMPLETE 2 VIEW COMPARISON:  Films from earlier in the same day FINDINGS: There remains a comminuted fracture with impaction and posterior angulation at the fracture site. Only minimal reduction is noted. IMPRESSION: Minimal reduction of the known distal radial fracture. Electronically Signed   By: Inez Catalina M.D.   On: 07/02/2015 20:52   Dg Wrist Complete Left  07/02/2015  CLINICAL DATA:  Fall while walking dog with wrist pain and deformity, initial encounter EXAM: LEFT WRIST - COMPLETE 3+ VIEW COMPARISON:  None. FINDINGS: Comminuted fracture of the distal left radius is noted with impaction and posterior angulation at the fracture site. Fracture extends into the radiocarpal articulation. No other fractures are noted. IMPRESSION: Comminuted distal radial fracture as described. Electronically Signed   By: Inez Catalina M.D.   On: 07/02/2015 18:46    Positive ROS: All other systems have been reviewed and were otherwise negative with the exception of those mentioned in the HPI and as above.  Physical Exam: Vitals: Refer to EMR. Constitutional:  WD, WN, NAD HEENT:  NCAT, EOMI Neuro/Psych:  Alert & oriented to person, place, and time; appropriate mood & affect Lymphatic: No generalized extremity edema or lymphadenopathy Extremities / MSK:  The extremities are normal with respect to appearance, ranges of motion, joint stability, muscle strength/tone, sensation, & perfusion except as otherwise noted:  Left distal radius obviously dorsally deformed and displaced. No tenderness about the elbow. Intact light touch sensibility in the radial, median, and ulnar nerve distributions with intact motor to the same. Fingers warm with brisk cap refill and radial pulse palpable.  Assessment: Markedly displaced left  extra-articular distal radius fracture  Plan: After placing the left upper extremity into fingertrap traction, I instilled 9 more mL's of lidocaine as a hematoma block. After 10-15 minutes of suspension with 5 pounds of traction, a gentle closed reduction was performed through manipulation. Fluoroscopic assessment revealed a much better aligned distal radius fracture, with slight loss of radial inclination and good tilt. Sugar tong splint was applied and final images were obtained.  He was provided instructions regarding splint care, signs and symptoms of compartment syndrome which should warrant prompt reevaluation, and an overall strategy for checking the adequacy of the reduction with reevaluation on Monday or Tuesday of the week of the 16th in our office. He was instructed to engage in activities no more than lifting a pen/paper/knife/fork. He was also provided a sling for elevation. Because of his history of substance  abuse, we elected an option for pain control using Tylenol, Advil, and tramadol as needed. Our office will call him on Monday or Tuesday to arrange appointment for the following week.  Radiographs: Following reduction and application of sugar tong splint, AP and lateral spot films of the left wrist were obtained, revealing near-anatomic alignment of the next articular distal radius fracture on the lateral, with some mild loss of radial inclination on the AP area fine bony detail is obscured by plaster splint.  Rayvon Char [address], [place], [place]  [id number] Office: [phone] Mobile: [phone]

## 2015-07-02 NOTE — Discharge Instructions (Signed)
Wrist Fracture °A wrist fracture is a break or crack in one of the bones of your wrist. Your wrist is made up of eight small bones at the palm of your hand (carpal bones) and two long bones that make up your forearm (radius and ulna). °CAUSES °· A direct blow to the wrist. °· Falling on an outstretched hand. °· Trauma, such as a car accident or a fall. °RISK FACTORS °Risk factors for wrist fracture include: °· Participating in contact and high-risk sports, such as skiing, biking, and ice skating. °· Taking steroid medicines. °· Smoking. °· Being male. °· Being Caucasian. °· Drinking more than three alcoholic beverages per day. °· Having low or lowered bone density (osteoporosis or osteopenia). °· Age. Older adults have decreased bone density. °· Women who have had menopause. °· History of previous fractures. °SIGNS AND SYMPTOMS °Symptoms of wrist fractures include tenderness, bruising, and inflammation. Additionally, the wrist may hang in an odd position or appear deformed. °DIAGNOSIS °Diagnosis may include: °· Physical exam. °· X-ray. °TREATMENT °Treatment depends on many factors, including the nature and location of the fracture, your age, and your activity level. Treatment for wrist fracture can be nonsurgical or surgical. °Nonsurgical Treatment °A plaster cast or splint may be applied to your wrist if the bone is in a good position. If the fracture is not in good position, it may be necessary for your health care provider to realign it before applying a splint or cast. Usually, a cast or splint will be worn for several weeks. °Surgical Treatment °Sometimes the position of the bone is so far out of place that surgery is required to apply a device to hold it together as it heals. Depending on the fracture, there are a number of options for holding the bone in place while it heals, such as a cast and metal pins. °HOME CARE INSTRUCTIONS °· Keep your injured wrist elevated and move your fingers as much as  possible. °· Do not put pressure on any part of your cast or splint. It may break. °· Use a plastic bag to protect your cast or splint from water while bathing or showering. Do not lower your cast or splint into water. °· Take medicines only as directed by your health care provider. °· Keep your cast or splint clean and dry. If it becomes wet, damaged, or suddenly feels too tight, contact your health care provider right away. °· Do not use any tobacco products including cigarettes, chewing tobacco, or electronic cigarettes. Tobacco can delay bone healing. If you need help quitting, ask your health care provider. °· Keep all follow-up visits as directed by your health care provider. This is important. °· Ask your health care provider if you should take supplements of calcium and vitamins C and D to promote bone healing. °SEEK MEDICAL CARE IF: °· Your cast or splint is damaged, breaks, or gets wet. °· You have a fever. °· You have chills. °· You have continued severe pain or more swelling than you did before the cast was put on. °SEEK IMMEDIATE MEDICAL CARE IF: °· Your hand or fingernails on the injured arm turn blue or gray, or feel cold or numb. °· You have decreased feeling in the fingers of your injured arm. °MAKE SURE YOU: °· Understand these instructions. °· Will watch your condition. °· Will get help right away if you are not doing well or get worse. °  °This information is not intended to replace advice given to you by your   health care provider. Make sure you discuss any questions you have with your health care provider.   Document Released: 03/21/2005 Document Revised: 03/02/2015 Document Reviewed: 06/29/2011 Elsevier Interactive Patient Education 2016 [place] or Splint Care Casts and splints support injured limbs and keep bones from moving while they heal. It is important to care for your cast or splint at home.  HOME CARE INSTRUCTIONS  Keep the cast or splint uncovered during the  drying period. It can take 24 to 48 hours to dry if it is made of plaster. A fiberglass cast will dry in less than 1 hour.  Do not rest the cast on anything harder than a pillow for the first 24 hours.  Do not put weight on your injured limb or apply pressure to the cast until your health care provider gives you permission.  Keep the cast or splint dry. Wet casts or splints can lose their shape and may not support the limb as well. A wet cast that has lost its shape can also create harmful pressure on your skin when it dries. Also, wet skin can become infected.  Cover the cast or splint with a plastic bag when bathing or when out in the rain or snow. If the cast is on the trunk of the body, take sponge baths until the cast is removed.  If your cast does become wet, dry it with a towel or a blow dryer on the cool setting only.  Keep your cast or splint clean. Soiled casts may be wiped with a moistened cloth.  Do not place any hard or soft foreign objects under your cast or splint, such as cotton, toilet paper, lotion, or powder.  Do not try to scratch the skin under the cast with any object. The object could get stuck inside the cast. Also, scratching could lead to an infection. If itching is a problem, use a blow dryer on a cool setting to relieve discomfort.  Do not trim or cut your cast or remove padding from inside of it.  Exercise all joints next to the injury that are not immobilized by the cast or splint. For example, if you have a long leg cast, exercise the hip joint and toes. If you have an arm cast or splint, exercise the shoulder, elbow, thumb, and fingers.  Elevate your injured arm or leg on 1 or 2 pillows for the first 1 to 3 days to decrease swelling and pain.It is best if you can comfortably elevate your cast so it is higher than your heart. SEEK MEDICAL CARE IF:   Your cast or splint cracks.  Your cast or splint is too tight or too loose.  You have unbearable itching  inside the cast.  Your cast becomes wet or develops a soft spot or area.  You have a bad smell coming from inside your cast.  You get an object stuck under your cast.  Your skin around the cast becomes red or raw.  You have new pain or worsening pain after the cast has been applied. SEEK IMMEDIATE MEDICAL CARE IF:   You have fluid leaking through the cast.  You are unable to move your fingers or toes.  You have discolored (blue or white), cool, painful, or very swollen fingers or toes beyond the cast.  You have tingling or numbness around the injured area.  You have severe pain or pressure under the cast.  You have any difficulty with your breathing or have shortness of  breath.  You have chest pain.   This information is not intended to replace advice given to you by your health care provider. Make sure you discuss any questions you have with your health care provider.   Document Released: 06/08/2000 Document Revised: 04/01/2013 Document Reviewed: 12/18/2012 Elsevier Interactive Patient Education 2016 [employer]  Acute Compartment Syndrome Compartment syndrome is a painful condition that occurs when swelling and pressure build up in a body space (compartment) of the arms or legs. Groups of muscles, nerves, and blood vessels in the arms and legs are separated into various compartments. Each compartment is surrounded by tough layers of tissue called fascia. In compartment syndrome, pressure builds up within the layers of fascia and begins to push on the structures within that compartment.  In acute compartment syndrome, the pressure builds up suddenly, often as the result of an injury. This is a surgical emergency. When a muscle in the compartment moves, you may feel severe pain. If pressure continues to increase, it can block the flow of blood in the smallest blood vessels (capillaries). Then, the nerves and muscles in the compartment cannot get enough oxygen and nutrients  (substances needed for survival). They will start to die within 4-8 hours. That is why the pressure needs to be relieved immediately. Identifying the condition early and treating it quickly can prevent most problems. CAUSES  Various things can lead to compartment syndrome. Possible causes include:   Injury. Some injuries can cause swelling or bleeding in a compartment. This can lead to compartment syndrome. Injuries that may cause this problem include:  Broken bones, especially the long bones of the arms and legs.  Crushing injuries.  Penetrating injuries, such as a knife wound that punctures the skin and tissue underneath.  Badly bruised muscles.  Poisonous bites, such as a snake bite.  Severe burns.  Blocked blood flow. This could result from:  A cast or bandage that is too tight.  A surgical procedure. Blood flow sometimes has to be stopped for a while during a surgery, usually with a tourniquet.  Lying for too long in a position that restricts blood flow. This can happen in people who have nerve damage or if a person is unconscious for a long time.  Drugs used to build up muscles (anabolic steroids).  Drugs that keep the blood from forming clots (blood thinners). SIGNS AND SYMPTOMS  The most common symptom of compartment syndrome is pain. The pain may:   Get worse when moving or stretching the affected body part.  Be more severe than it should be for an injury.  Come along with a feeling of tingling or burning.  Become worse when the area is pushed or squeezed.  Be unaffected by pain medicine. Other symptoms include:   A feeling of tightness or fullness in the affected area.   A loss of feeling.  Weakness in the area.  Loss of movement.  Skin becoming pale, tight, and shiny over the painful area.  DIAGNOSIS  Your health care provider may suspect the problem based on how you describe the pain. The diagnosis is made by using a special device that measures the  pressure in the affected area. Blood tests, X-rays, or an ultrasound exam may be done to help rule out other problems.  TREATMENT  Compartment syndrome is a surgical emergency. It should be treated very quickly.   First-aid treatment is given first. This may include:  Promptly treating an injury.  Loosening or removing any cast, bandage,  or external wrap that may be causing pain.  Raising the painful arm or leg to the same level as the heart.  Giving oxygen.  Giving fluid through an IV access tube that is put into a vein in the hand or arm.  Surgery (fasciotomy) is needed to relieve the pressure and help prevent permanent damage. In this surgery, cuts (incisions) are made through the fascia to relieve the pressure in the compartment.   This information is not intended to replace advice given to you by your health care provider. Make sure you discuss any questions you have with your health care provider.   Document Released: 05/30/2009 Document Revised: 02/11/2013 Document Reviewed: 01/13/2013 Elsevier Interactive Patient Education [employer].

## 2015-07-02 NOTE — ED Provider Notes (Signed)
CSN: [id number]     Arrival date & time 07/02/15  1755 History  By signing my name below, I, Meriel Pica, attest that this documentation has been prepared under the direction and in the presence of Domenic Moras, PA-C.  Electronically Signed: Meriel Pica, ED Scribe. 07/02/2015. 6:09 PM.  Chief Complaint  Patient presents with  . Wrist Pain   The history is provided by the patient. No language interpreter was used.   HPI Comments: Luke White is a 55 y.o. male who presents to the Emergency Department complaining of an injury sustained to left wrist PTA s/p slipping on ice and falling onto out stretched left hand. There is significant swelling noted to anterior left wrist and pt associates severe pain to left wrist. He also notes numbness to left fingers. Pt states he is currently in AA and does not want any narcotic pain medication. Pain is sharp, burning, persistent worsening with movement. No c/o L elbow or shoulder pain.  Pt is R hand dominant.  Past Medical History  Diagnosis Date  . Anxiety   . GERD (gastroesophageal reflux disease)   . Allergic rhinitis   . Pancreatitis 2008    HX of  x2 ? cause  . Hypertension   . Depression   . Diabetes ([place])   . Hepatic steatosis    Past Surgical History  Procedure Laterality Date  . Esophagogastroduodenoscopy endoscopy N/A 2012    Dr. Ardis Hughs   Family History  Problem Relation Age of Onset  . Anxiety disorder Other   . Heart disease Neg Hx   . Hypertension Mother   . Hypertension Father   . Hyperlipidemia Father   . COPD Father   . Colon cancer Neg Hx   . Esophageal cancer Neg Hx   . Pancreatic cancer Neg Hx   . Stomach cancer Neg Hx   . Diabetes Maternal Uncle     x 3  . Diabetes Maternal Grandmother   . Kidney disease Neg Hx   . Liver disease Neg Hx    Social History  Substance Use Topics  . Smoking status: Former Smoker -- 0.25 packs/day for 27 years    Types: Cigarettes  . Smokeless tobacco: Never Used  . Alcohol  Use: No     Comment: stopped 46 days ago as of 04/21/15    Review of Systems  Musculoskeletal: Positive for joint swelling and arthralgias.  Neurological: Positive for numbness.   Allergies  Review of patient's allergies indicates no known allergies.  Home Medications   Prior to Admission medications   Medication Sig Start Date End Date Taking? Authorizing Provider  acetaminophen (TYLENOL) 500 MG tablet Take 1,000 mg by mouth every 6 (six) hours as needed for moderate pain.   Yes Historical Provider, MD  buPROPion (WELLBUTRIN XL) 150 MG 24 hr tablet Take 150 mg by mouth daily.   Yes Historical Provider, MD  busPIRone (BUSPAR) 15 MG tablet TAKE 1 TABLET BY MOUTH TWICE DAILY 06/21/15  Yes Aleksei Plotnikov V, MD  cholecalciferol (VITAMIN D) 1000 UNITS tablet Take 1 tablet (1,000 Units total) by mouth daily. 12/12/14  Yes Niel Hummer, NP  hydrOXYzine (ATARAX/VISTARIL) 50 MG tablet Take 1-2 tablets by mouth 3 (three) times daily as needed. 03/07/15  Yes Historical Provider, MD  ibuprofen (ADVIL,MOTRIN) 200 MG tablet Take 400 mg by mouth every 6 (six) hours as needed for moderate pain.   Yes Historical Provider, MD  lipase/protease/amylase (CREON) 36000 UNITS CPEP capsule Take 1 capsule (36,000  Units total) by mouth 3 (three) times daily before meals. Patient taking differently: Take 36,000 Units by mouth 2 (two) times daily with a meal.  04/06/15  Yes Aleksei Plotnikov V, MD  metFORMIN (GLUCOPHAGE) 500 MG tablet Take 1 tablet by mouth 2 (two) times daily before a meal. 04/01/15  Yes Historical Provider, MD  naltrexone (DEPADE) 50 MG tablet Take 1 tablet (50 mg total) by mouth every morning. 04/28/15  Yes Aleksei Plotnikov V, MD  omeprazole (PRILOSEC) 40 MG capsule TAKE ONE CAPSULE BY MOUTH DAILY 04/08/15  Yes Aleksei Plotnikov V, MD  traZODone (DESYREL) 50 MG tablet Take 1 tablet (50 mg total) by mouth at bedtime. 04/28/15  Yes Aleksei Plotnikov V, MD  glucose blood (BAYER CONTOUR NEXT TEST) test  strip 1 each by Other route 2 (two) times daily. Use to check blood sugars twice a day Dx E11.9 04/08/15   Aleksei Plotnikov V, MD   BP 160/93 mmHg  Pulse 93  Temp(Src) 98 F (36.7 C) (Oral)  Resp 18  SpO2 98% Physical Exam  Constitutional: He is oriented to person, place, and time. He appears well-developed and well-nourished. No distress.  HENT:  Head: Normocephalic.  Eyes: Conjunctivae are normal.  Neck: Normal range of motion. Neck supple.  Cardiovascular: Normal rate.   Pulmonary/Chest: Effort normal. No respiratory distress.  Musculoskeletal: Normal range of motion. He exhibits edema and tenderness.  Tenderness throughout wrist with obvious deformity and crepitus on palpation; radial pulse palpable; mildly decreased sensation throughout left fingers; normal skin tone and brisk capillary refill; wrist limited ROM secondary to pain; left elbow normal; left shoulder normal.  Neurological: He is alert and oriented to person, place, and time. Coordination normal.  Skin: Skin is warm.  Psychiatric: He has a normal mood and affect. His behavior is normal.  Nursing note and vitals reviewed.   ED Course  Reduction of fracture Date/Time: 07/02/2015 8:06 PM Performed by: Domenic Moras Authorized by: Domenic Moras Consent: Verbal consent obtained. Risks and benefits: risks, benefits and alternatives were discussed Consent given by: patient Patient understanding: patient states understanding of the procedure being performed Patient consent: the patient's understanding of the procedure matches consent given Patient identity confirmed: verbally with patient and arm band Time out: Immediately prior to procedure a "time out" was called to verify the correct patient, procedure, equipment, support staff and site/side marked as required. Local anesthesia used: yes Anesthesia: hematoma block Local anesthetic: lidocaine 2% without epinephrine Anesthetic total: 10 ml Patient sedated: no Comments:  Reduction of L distal radial fracture was performed by me with Dr. Tyrone Nine.  Improvement of anatomic but unable to fully restore normal anatomy.      DIAGNOSTIC STUDIES: Oxygen Saturation is 98% on RA, normal by my interpretation.    COORDINATION OF CARE: 6:07 PM Discussed treatment plan which includes to order fentanyl for pain management. Xray of left wrist ordered. Pt acknowledges and agrees to plan.   6:53 PM Imaging resulted. Comminuted distal radial fracture to left wrist. Will perform reduction in the ED. Hematoma block performed with 10cc of lidocaine  2% without epinephrine. Pt agreeable to plan.   8:17 PM Reduction attempted by myself and attending Dr. Tyrone Nine without restoration of anatomy.  Will consult with hand surgery Dr. Grandville Silos.  8:35 PM Xray left wrist ordered after post reduction attempt.   9:24 PM No significant improvement after post reduction xray.  Dr. Grandville Silos was consulted and agrees to see pt in the ER and will perform reduction.  Will  continue pain management.    10:06 PM Successful reduction by Dr. Grandville Silos.  Pt placed in a sugar tong splint.  Pt will be discharge with pain medication and outpt f/u .    Imaging Review  Dg Wrist Complete Left  07/02/2015  CLINICAL DATA:  Status post reduction EXAM: LEFT WRIST - COMPLETE 2 VIEW COMPARISON:  Films from earlier in the same day FINDINGS: There remains a comminuted fracture with impaction and posterior angulation at the fracture site. Only minimal reduction is noted. IMPRESSION: Minimal reduction of the known distal radial fracture. Electronically Signed   By: Inez Catalina M.D.   On: 07/02/2015 20:52   Dg Wrist Complete Left  07/02/2015  CLINICAL DATA:  Fall while walking dog with wrist pain and deformity, initial encounter EXAM: LEFT WRIST - COMPLETE 3+ VIEW COMPARISON:  None. FINDINGS: Comminuted fracture of the distal left radius is noted with impaction and posterior angulation at the fracture site. Fracture extends into  the radiocarpal articulation. No other fractures are noted. IMPRESSION: Comminuted distal radial fracture as described. Electronically Signed   By: Inez Catalina M.D.   On: 07/02/2015 18:46    I have personally reviewed and evaluated these images results as part of my medical decision-making.   MDM   Final diagnoses:  None    Patient X-Ray positive for comminuted distal radial fracture. Will attempt reduction in the ED. Pt advised to follow up with hand surgery Dr. Grandville Silos. Patient given splint while in ED, conservative therapy recommended and discussed. Patient will be discharged home & is agreeable with above plan. Returns precautions discussed. Pt appears safe for discharge.  I personally performed the services described in this documentation, which was scribed in my presence. The recorded information has been reviewed and is accurate.   BP 136/83 mmHg  Pulse 73  Temp(Src) 98 F (36.7 C) (Oral)  Resp 16  SpO2 98%    Domenic Moras, PA-C 07/02/15 [place], DO 07/02/15 2229

## 2015-07-02 NOTE — ED Notes (Signed)
Asked pt how he was getting home, pt stated he "was going to take an [place]" because he did drive his car here. Offered to call a cab for pt and once again stated he was going to take an [place]

## 2015-07-02 NOTE — Progress Notes (Signed)
Orthopedic Tech Progress Note Patient Details:  Jamien Korab [facility] August 10, 1960 [id number]  Ortho Devices Type of Ortho Device: Ace wrap, Arm sling, Sugartong splint Ortho Device/Splint Location: LUE Ortho Device/Splint Interventions: Ordered, Application   Braulio Bosch 07/02/2015, 10:01 PM

## 2015-07-02 NOTE — ED Notes (Signed)
PT reports he slipped on ice while walking his dogs. Pt has deformity to Lt wrist

## 2015-07-06 ENCOUNTER — Other Ambulatory Visit: Payer: Self-pay | Admitting: Internal Medicine

## 2015-07-08 ENCOUNTER — Ambulatory Visit (INDEPENDENT_AMBULATORY_CARE_PROVIDER_SITE_OTHER): Payer: BLUE CROSS/BLUE SHIELD | Admitting: Psychology

## 2015-07-08 ENCOUNTER — Encounter: Payer: Self-pay | Admitting: Internal Medicine

## 2015-07-08 ENCOUNTER — Other Ambulatory Visit (INDEPENDENT_AMBULATORY_CARE_PROVIDER_SITE_OTHER): Payer: BLUE CROSS/BLUE SHIELD

## 2015-07-08 ENCOUNTER — Ambulatory Visit (INDEPENDENT_AMBULATORY_CARE_PROVIDER_SITE_OTHER): Payer: BLUE CROSS/BLUE SHIELD | Admitting: Internal Medicine

## 2015-07-08 VITALS — BP 170/92 | HR 81 | Wt 167.0 lb

## 2015-07-08 DIAGNOSIS — R350 Frequency of micturition: Secondary | ICD-10-CM | POA: Insufficient documentation

## 2015-07-08 DIAGNOSIS — K860 Alcohol-induced chronic pancreatitis: Secondary | ICD-10-CM

## 2015-07-08 DIAGNOSIS — S62102D Fracture of unspecified carpal bone, left wrist, subsequent encounter for fracture with routine healing: Secondary | ICD-10-CM | POA: Diagnosis not present

## 2015-07-08 DIAGNOSIS — S62102A Fracture of unspecified carpal bone, left wrist, initial encounter for closed fracture: Secondary | ICD-10-CM | POA: Insufficient documentation

## 2015-07-08 DIAGNOSIS — F4321 Adjustment disorder with depressed mood: Secondary | ICD-10-CM

## 2015-07-08 DIAGNOSIS — F4323 Adjustment disorder with mixed anxiety and depressed mood: Secondary | ICD-10-CM | POA: Diagnosis not present

## 2015-07-08 DIAGNOSIS — E119 Type 2 diabetes mellitus without complications: Secondary | ICD-10-CM

## 2015-07-08 DIAGNOSIS — F1021 Alcohol dependence, in remission: Secondary | ICD-10-CM

## 2015-07-08 LAB — HEPATIC FUNCTION PANEL
ALBUMIN: 4.3 g/dL (ref 3.5–5.2)
ALT: 15 U/L (ref 0–53)
AST: 20 U/L (ref 0–37)
Alkaline Phosphatase: 97 U/L (ref 39–117)
Bilirubin, Direct: 0.1 mg/dL (ref 0.0–0.3)
TOTAL PROTEIN: 7.2 g/dL (ref 6.0–8.3)
Total Bilirubin: 0.3 mg/dL (ref 0.2–1.2)

## 2015-07-08 LAB — CBC WITH DIFFERENTIAL/PLATELET
BASOS ABS: 0 10*3/uL (ref 0.0–0.1)
Basophils Relative: 0.4 % (ref 0.0–3.0)
Eosinophils Absolute: 0.3 10*3/uL (ref 0.0–0.7)
Eosinophils Relative: 3.6 % (ref 0.0–5.0)
HEMATOCRIT: 41.4 % (ref 39.0–52.0)
HEMOGLOBIN: 13.7 g/dL (ref 13.0–17.0)
Lymphocytes Relative: 22.8 % (ref 12.0–46.0)
Lymphs Abs: 1.6 10*3/uL (ref 0.7–4.0)
MCHC: 33.1 g/dL (ref 30.0–36.0)
MCV: 84.9 fl (ref 78.0–100.0)
Monocytes Absolute: 0.5 10*3/uL (ref 0.1–1.0)
Monocytes Relative: 7.4 % (ref 3.0–12.0)
Neutro Abs: 4.7 10*3/uL (ref 1.4–7.7)
Neutrophils Relative %: 65.8 % (ref 43.0–77.0)
Platelets: 100 10*3/uL — ABNORMAL LOW (ref 150.0–400.0)
RBC: 4.88 Mil/uL (ref 4.22–5.81)
RDW: 13.4 % (ref 11.5–15.5)
WBC: 7.1 10*3/uL (ref 4.0–10.5)

## 2015-07-08 LAB — URINALYSIS
BILIRUBIN URINE: NEGATIVE
HGB URINE DIPSTICK: NEGATIVE
Ketones, ur: NEGATIVE
Leukocytes, UA: NEGATIVE
Nitrite: NEGATIVE
Specific Gravity, Urine: 1.01 (ref 1.000–1.030)
TOTAL PROTEIN, URINE-UPE24: NEGATIVE
URINE GLUCOSE: NEGATIVE
Urobilinogen, UA: 0.2 (ref 0.0–1.0)
pH: 6 (ref 5.0–8.0)

## 2015-07-08 LAB — BASIC METABOLIC PANEL
BUN: 15 mg/dL (ref 6–23)
CHLORIDE: 101 meq/L (ref 96–112)
CO2: 29 mEq/L (ref 19–32)
CREATININE: 0.92 mg/dL (ref 0.40–1.50)
Calcium: 9.8 mg/dL (ref 8.4–10.5)
GFR: 90.98 mL/min (ref >60.00)
GLUCOSE: 117 mg/dL — AB (ref 70–99)
Potassium: 4.9 mEq/L (ref 3.5–5.1)
Sodium: 136 mEq/L (ref 135–145)

## 2015-07-08 LAB — PSA: PSA: 0.6 ng/mL (ref 0.10–4.00)

## 2015-07-08 LAB — HEMOGLOBIN A1C: HEMOGLOBIN A1C: 6.8 % — AB (ref 4.6–6.5)

## 2015-07-08 MED ORDER — PANCRELIPASE (LIP-PROT-AMYL) 36000-114000 UNITS PO CPEP: 36000.0000 [IU] | ORAL_CAPSULE | Freq: Three times a day (TID) | ORAL | Status: DC

## 2015-07-08 NOTE — Assessment & Plan Note (Signed)
On Creon

## 2015-07-08 NOTE — Assessment & Plan Note (Signed)
Labs Metformin 

## 2015-07-08 NOTE — Assessment & Plan Note (Signed)
1/17 Dr Grandville Silos  "Assessment: Markedly displaced left extra-articular distal radius fracture  Plan: After placing the left upper extremity into fingertrap traction, I instilled 9 more mL's of lidocaine as a hematoma block. After 10-15 minutes of suspension with 5 pounds of traction, a gentle closed reduction was performed through manipulation. Fluoroscopic assessment revealed a much better aligned distal radius fracture, with slight loss of radial inclination and good tilt. Sugar tong splint was applied and final images were obtained.  He was provided instructions regarding splint care, signs and symptoms of compartment syndrome which should warrant prompt reevaluation, and an overall strategy for checking the adequacy of the reduction with reevaluation on Monday or Tuesday of the week of the 16th in our office. He was instructed to engage in activities no more than lifting a pen/paper/knife/fork. He was also provided a sling for elevation. Because of his history of substance abuse, we elected an option for pain control using Tylenol, Advil, and tramadol as needed. Our office will call him on Monday or Tuesday to arrange appointment for the following week."

## 2015-07-08 NOTE — Progress Notes (Signed)
Subjective:  Patient ID: Luke White, male    DOB: [birth year]  Age: 55 y.o. MRN: [id number]  CC: No chief complaint on file.   HPI Joseangel Grissett Bello presents for depression, chronic pancreatitis, new wrist fx L. Truvada discussed - pancreatitis side effect is worrisome C/o urinary frequency - daytime   "Assessment: Markedly displaced left extra-articular distal radius fracture  Plan: After placing the left upper extremity into fingertrap traction, I instilled 9 more mL's of lidocaine as a hematoma block. After 10-15 minutes of suspension with 5 pounds of traction, a gentle closed reduction was performed through manipulation. Fluoroscopic assessment revealed a much better aligned distal radius fracture, with slight loss of radial inclination and good tilt. Sugar tong splint was applied and final images were obtained.  He was provided instructions regarding splint care, signs and symptoms of compartment syndrome which should warrant prompt reevaluation, and an overall strategy for checking the adequacy of the reduction with reevaluation on Monday or Tuesday of the week of the 16th in our office. He was instructed to engage in activities no more than lifting a pen/paper/knife/fork. He was also provided a sling for elevation. Because of his history of substance abuse, we elected an option for pain control using Tylenol, Advil, and tramadol as needed. Our office will call him on Monday or Tuesday to arrange appointment for the following week."   Outpatient Prescriptions Prior to Visit  Medication Sig Dispense Refill  . acetaminophen (TYLENOL) 500 MG tablet Take 1,000 mg by mouth every 6 (six) hours as needed for moderate pain.    [name] buPROPion (WELLBUTRIN XL) 150 MG 24 hr tablet Take 150 mg by mouth daily.    . busPIRone (BUSPAR) 15 MG tablet TAKE 1 TABLET BY MOUTH TWICE DAILY 180 tablet 0  . cholecalciferol (VITAMIN D) 1000 UNITS tablet Take 1 tablet (1,000 Units total) by mouth daily.    [name]  glucose blood (BAYER CONTOUR NEXT TEST) test strip 1 each by Other route 2 (two) times daily. Use to check blood sugars twice a day Dx E11.9 100 each 3  . hydrOXYzine (ATARAX/VISTARIL) 25 MG tablet TAKE 1 TABLET BY MOUTH THREE TIMES DAILY AS NEEDED FOR ANXIETY 60 tablet 1  . ibuprofen (ADVIL,MOTRIN) 200 MG tablet Take 400 mg by mouth every 6 (six) hours as needed for moderate pain.    . metFORMIN (GLUCOPHAGE) 500 MG tablet Take 1 tablet by mouth 2 (two) times daily before a meal.  2  . naltrexone (DEPADE) 50 MG tablet Take 1 tablet (50 mg total) by mouth every morning. 30 tablet 5  . omeprazole (PRILOSEC) 40 MG capsule TAKE ONE CAPSULE BY MOUTH DAILY 90 capsule 3  . traMADol (ULTRAM) 50 MG tablet Take 1-2 tablets (50-100 mg total) by mouth every 6 (six) hours as needed for severe pain (Max 8 tablets daily). 60 tablet 0  . traZODone (DESYREL) 50 MG tablet Take 1 tablet (50 mg total) by mouth at bedtime. 30 tablet 5  . lipase/protease/amylase (CREON) 36000 UNITS CPEP capsule Take 1 capsule (36,000 Units total) by mouth 3 (three) times daily before meals. (Patient taking differently: Take 36,000 Units by mouth 2 (two) times daily with a meal. ) 90 capsule 3   No facility-administered medications prior to visit.    ROS Review of Systems  Constitutional: Negative for appetite change, fatigue and unexpected weight change.  HENT: Negative for congestion, nosebleeds, sneezing, sore throat and trouble swallowing.   Eyes: Negative for itching and visual disturbance.  Respiratory: Negative for cough.   Cardiovascular: Negative for chest pain, palpitations and leg swelling.  Gastrointestinal: Negative for nausea, diarrhea, blood in stool and abdominal distention.  Genitourinary: Negative for frequency and hematuria.  Musculoskeletal: Positive for arthralgias. Negative for back pain, joint swelling, gait problem and neck pain.  Skin: Negative for rash.  Neurological: Negative for dizziness, tremors,  speech difficulty and weakness.  Psychiatric/Behavioral: Negative for suicidal ideas, sleep disturbance, dysphoric mood and agitation. The patient is nervous/anxious.     Objective:  BP 170/92 mmHg  Pulse 81  Wt 167 lb (75.751 kg)  SpO2 96%  BP Readings from Last 3 Encounters:  07/08/15 170/92  07/02/15 136/83  04/29/15 126/89    Wt Readings from Last 3 Encounters:  07/08/15 167 lb (75.751 kg)  04/29/15 160 lb (72.576 kg)  04/21/15 160 lb 9.6 oz (72.848 kg)    Physical Exam  Constitutional: He is oriented to person, place, and time. He appears well-developed. No distress.  NAD  HENT:  Mouth/Throat: Oropharynx is clear and moist.  Eyes: Conjunctivae are normal. Pupils are equal, round, and reactive to light.  Neck: Normal range of motion. No JVD present. No thyromegaly present.  Cardiovascular: Normal rate, regular rhythm, normal heart sounds and intact distal pulses.  Exam reveals no gallop and no friction rub.   No murmur heard. Pulmonary/Chest: Effort normal and breath sounds normal. No respiratory distress. He has no wheezes. He has no rales. He exhibits no tenderness.  Abdominal: Soft. Bowel sounds are normal. He exhibits no distension and no mass. There is no tenderness. There is no rebound and no guarding.  Musculoskeletal: Normal range of motion. He exhibits no edema or tenderness.  Lymphadenopathy:    He has no cervical adenopathy.  Neurological: He is alert and oriented to person, place, and time. He has normal reflexes. No cranial nerve deficit. He exhibits normal muscle tone. He displays a negative Romberg sign. Coordination and gait normal.  Skin: Skin is warm and dry. No rash noted.  Psychiatric: He has a normal mood and affect. His behavior is normal. Judgment and thought content normal.    Lab Results  Component Value Date   WBC 7.1 07/08/2015   HGB 13.7 07/08/2015   HCT 41.4 07/08/2015   PLT 100.0* 07/08/2015   GLUCOSE 117* 07/08/2015   CHOL 213*  03/15/2014   TRIG 129.0 03/15/2014   HDL 53.60 03/15/2014   LDLDIRECT 149.4 01/12/2011   LDLCALC 134* 03/15/2014   ALT 15 07/08/2015   AST 20 07/08/2015   NA 136 07/08/2015   K 4.9 07/08/2015   CL 101 07/08/2015   CREATININE 0.92 07/08/2015   BUN 15 07/08/2015   CO2 29 07/08/2015   TSH 2.21 03/15/2014   PSA 0.60 07/08/2015   INR 1.0 04/21/2015   HGBA1C 6.8* 07/08/2015    Dg Wrist Complete Left  07/02/2015  CLINICAL DATA:  Status post reduction EXAM: LEFT WRIST - COMPLETE 2 VIEW COMPARISON:  Films from earlier in the same day FINDINGS: There remains a comminuted fracture with impaction and posterior angulation at the fracture site. Only minimal reduction is noted. IMPRESSION: Minimal reduction of the known distal radial fracture. Electronically Signed   By: Inez Catalina M.D.   On: 07/02/2015 20:52   Dg Wrist Complete Left  07/02/2015  CLINICAL DATA:  Fall while walking dog with wrist pain and deformity, initial encounter EXAM: LEFT WRIST - COMPLETE 3+ VIEW COMPARISON:  None. FINDINGS: Comminuted fracture of the distal left radius is  noted with impaction and posterior angulation at the fracture site. Fracture extends into the radiocarpal articulation. No other fractures are noted. IMPRESSION: Comminuted distal radial fracture as described. Electronically Signed   By: Inez Catalina M.D.   On: 07/02/2015 18:46    Assessment & Plan:   Diagnoses and all orders for this visit:  Grief -     Urinalysis; Future -     PSA; Future -     Basic metabolic panel; Future -     Hemoglobin A1c; Future -     Hepatic function panel; Future -     CBC with Differential/Platelet; Future  Alcohol-induced chronic pancreatitis (HCC) -     Urinalysis; Future -     PSA; Future -     Basic metabolic panel; Future -     Hemoglobin A1c; Future -     Hepatic function panel; Future -     CBC with Differential/Platelet; Future  Wrist fracture, left, with routine healing, subsequent encounter -      Urinalysis; Future -     PSA; Future -     Basic metabolic panel; Future -     Hemoglobin A1c; Future -     Hepatic function panel; Future -     CBC with Differential/Platelet; Future  Urinary frequency  Other orders -     lipase/protease/amylase (CREON) 36000 UNITS CPEP capsule; Take 1 capsule (36,000 Units total) by mouth 3 (three) times daily before meals.   I am having Mr. Alban maintain his cholecalciferol, acetaminophen, ibuprofen, metFORMIN, omeprazole, glucose blood, traZODone, naltrexone, busPIRone, buPROPion, traMADol, hydrOXYzine, and lipase/protease/amylase.  Meds ordered this encounter  Medications  . lipase/protease/amylase (CREON) 36000 UNITS CPEP capsule    Sig: Take 1 capsule (36,000 Units total) by mouth 3 (three) times daily before meals.    Dispense:  270 capsule    Refill:  3     Follow-up: Return in about 3 months (around 10/06/2015) for a follow-up visit.  [name], MD

## 2015-07-08 NOTE — Assessment & Plan Note (Signed)
Labs Consider Rx 

## 2015-07-08 NOTE — Progress Notes (Signed)
Pre visit review using our clinic review tool, if applicable. No additional management support is needed unless otherwise documented below in the visit note. 

## 2015-07-08 NOTE — Assessment & Plan Note (Signed)
Doing better.   

## 2015-07-11 MED ORDER — TAMSULOSIN HCL 0.4 MG PO CAPS: 0.4000 mg | ORAL_CAPSULE | Freq: Every day | ORAL | Status: DC

## 2015-07-12 ENCOUNTER — Encounter: Payer: Self-pay | Admitting: Internal Medicine

## 2015-07-25 ENCOUNTER — Ambulatory Visit (INDEPENDENT_AMBULATORY_CARE_PROVIDER_SITE_OTHER): Payer: BLUE CROSS/BLUE SHIELD | Admitting: Psychology

## 2015-07-25 DIAGNOSIS — F4323 Adjustment disorder with mixed anxiety and depressed mood: Secondary | ICD-10-CM

## 2015-07-25 DIAGNOSIS — F1010 Alcohol abuse, uncomplicated: Secondary | ICD-10-CM | POA: Diagnosis not present

## 2015-07-28 ENCOUNTER — Encounter: Payer: Self-pay | Admitting: Internal Medicine

## 2015-09-24 ENCOUNTER — Other Ambulatory Visit: Payer: Self-pay | Admitting: Internal Medicine

## 2015-10-04 ENCOUNTER — Encounter: Payer: Self-pay | Admitting: Internal Medicine

## 2015-10-04 ENCOUNTER — Ambulatory Visit (INDEPENDENT_AMBULATORY_CARE_PROVIDER_SITE_OTHER): Payer: BLUE CROSS/BLUE SHIELD | Admitting: Internal Medicine

## 2015-10-04 VITALS — BP 130/84 | HR 112 | Wt 166.0 lb

## 2015-10-04 DIAGNOSIS — I10 Essential (primary) hypertension: Secondary | ICD-10-CM | POA: Diagnosis not present

## 2015-10-04 DIAGNOSIS — F329 Major depressive disorder, single episode, unspecified: Secondary | ICD-10-CM

## 2015-10-04 DIAGNOSIS — E119 Type 2 diabetes mellitus without complications: Secondary | ICD-10-CM | POA: Diagnosis not present

## 2015-10-04 DIAGNOSIS — F419 Anxiety disorder, unspecified: Secondary | ICD-10-CM

## 2015-10-04 DIAGNOSIS — Z23 Encounter for immunization: Secondary | ICD-10-CM | POA: Diagnosis not present

## 2015-10-04 DIAGNOSIS — F418 Other specified anxiety disorders: Secondary | ICD-10-CM

## 2015-10-04 DIAGNOSIS — F32A Depression, unspecified: Secondary | ICD-10-CM

## 2015-10-04 MED ORDER — METFORMIN HCL ER 500 MG PO TB24: 500.0000 mg | ORAL_TABLET | Freq: Every day | ORAL | Status: DC

## 2015-10-04 NOTE — Assessment & Plan Note (Signed)
Trazodone  °

## 2015-10-04 NOTE — Progress Notes (Signed)
Subjective:  Patient ID: Luke White, male    DOB: [birth year]  Age: 55 y.o. MRN: [id number]  CC: No chief complaint on file.   HPI   Luke White presents for depression, DM, pancreatitis  F/u. Pt is 9 mo w/o alcohol   Outpatient Prescriptions Prior to Visit  Medication Sig Dispense Refill  . acetaminophen (TYLENOL) 500 MG tablet Take 1,000 mg by mouth every 6 (six) hours as needed for moderate pain.    [name] buPROPion (WELLBUTRIN XL) 150 MG 24 hr tablet Take 150 mg by mouth daily.    . busPIRone (BUSPAR) 15 MG tablet TAKE 1 TABLET BY MOUTH TWICE DAILY 180 tablet 0  . cholecalciferol (VITAMIN D) 1000 UNITS tablet Take 1 tablet (1,000 Units total) by mouth daily.    [name] glucose blood (BAYER CONTOUR NEXT TEST) test strip 1 each by Other route 2 (two) times daily. Use to check blood sugars twice a day Dx E11.9 100 each 3  . hydrOXYzine (ATARAX/VISTARIL) 25 MG tablet TAKE 1 TABLET BY MOUTH THREE TIMES DAILY AS NEEDED FOR ANXIETY 60 tablet 1  . ibuprofen (ADVIL,MOTRIN) 200 MG tablet Take 400 mg by mouth every 6 (six) hours as needed for moderate pain.    [name] omeprazole (PRILOSEC) 40 MG capsule TAKE ONE CAPSULE BY MOUTH DAILY 90 capsule 3  . tamsulosin (FLOMAX) 0.4 MG CAPS capsule Take 1 capsule (0.4 mg total) by mouth daily. 30 capsule 11  . traZODone (DESYREL) 50 MG tablet Take 1 tablet (50 mg total) by mouth at bedtime. 30 tablet 5  . metFORMIN (GLUCOPHAGE) 500 MG tablet Take 1 tablet by mouth 2 (two) times daily before a meal.  2  . lipase/protease/amylase (CREON) 36000 UNITS CPEP capsule Take 1 capsule (36,000 Units total) by mouth 3 (three) times daily before meals. (Patient not taking: Reported on 10/04/2015) 270 capsule 3  . naltrexone (DEPADE) 50 MG tablet Take 1 tablet (50 mg total) by mouth every morning. (Patient not taking: Reported on 10/04/2015) 30 tablet 5  . traMADol (ULTRAM) 50 MG tablet Take 1-2 tablets (50-100 mg total) by mouth every 6 (six) hours as needed for severe pain (Max  8 tablets daily). (Patient not taking: Reported on 10/04/2015) 60 tablet 0   No facility-administered medications prior to visit.    ROS Review of Systems  Constitutional: Negative for appetite change, fatigue and unexpected weight change.  HENT: Negative for congestion, nosebleeds, sneezing, sore throat and trouble swallowing.   Eyes: Negative for itching and visual disturbance.  Respiratory: Negative for cough.   Cardiovascular: Negative for chest pain, palpitations and leg swelling.  Gastrointestinal: Negative for nausea, diarrhea, blood in stool and abdominal distention.  Genitourinary: Negative for frequency and hematuria.  Musculoskeletal: Positive for joint swelling. Negative for back pain, gait problem and neck pain.  Skin: Negative for rash.  Neurological: Negative for dizziness, tremors, speech difficulty and weakness.  Psychiatric/Behavioral: Negative for suicidal ideas, confusion, sleep disturbance, dysphoric mood, decreased concentration and agitation. The patient is not nervous/anxious.     Objective:  BP 130/84 mmHg  Pulse 112  Wt 166 lb (75.297 kg)  SpO2 98%  BP Readings from Last 3 Encounters:  10/04/15 130/84  07/08/15 170/92  07/02/15 136/83    Wt Readings from Last 3 Encounters:  10/04/15 166 lb (75.297 kg)  07/08/15 167 lb (75.751 kg)  04/29/15 160 lb (72.576 kg)    Physical Exam  Constitutional: He is oriented to person, place, and time. He appears well-developed.  No distress.  NAD  HENT:  Mouth/Throat: Oropharynx is clear and moist.  Eyes: Conjunctivae are normal. Pupils are equal, round, and reactive to light.  Neck: Normal range of motion. No JVD present. No thyromegaly present.  Cardiovascular: Normal rate, regular rhythm, normal heart sounds and intact distal pulses.  Exam reveals no gallop and no friction rub.   No murmur heard. Pulmonary/Chest: Effort normal and breath sounds normal. No respiratory distress. He has no wheezes. He has no  rales. He exhibits no tenderness.  Abdominal: Soft. Bowel sounds are normal. He exhibits no distension and no mass. There is no tenderness. There is no rebound and no guarding.  Musculoskeletal: Normal range of motion. He exhibits tenderness. He exhibits no edema.  Lymphadenopathy:    He has no cervical adenopathy.  Neurological: He is alert and oriented to person, place, and time. He has normal reflexes. No cranial nerve deficit. He exhibits normal muscle tone. He displays a negative Romberg sign. Coordination and gait normal.  Skin: Skin is warm and dry. No rash noted.  Psychiatric: He has a normal mood and affect. His behavior is normal. Judgment and thought content normal.  L wrist is swollen post op  Lab Results  Component Value Date   WBC 7.1 07/08/2015   HGB 13.7 07/08/2015   HCT 41.4 07/08/2015   PLT 100.0* 07/08/2015   GLUCOSE 117* 07/08/2015   CHOL 213* 03/15/2014   TRIG 129.0 03/15/2014   HDL 53.60 03/15/2014   LDLDIRECT 149.4 01/12/2011   LDLCALC 134* 03/15/2014   ALT 15 07/08/2015   AST 20 07/08/2015   NA 136 07/08/2015   K 4.9 07/08/2015   CL 101 07/08/2015   CREATININE 0.92 07/08/2015   BUN 15 07/08/2015   CO2 29 07/08/2015   TSH 2.21 03/15/2014   PSA 0.60 07/08/2015   INR 1.0 04/21/2015   HGBA1C 6.8* 07/08/2015    Dg Wrist Complete Left  07/02/2015  CLINICAL DATA:  Status post reduction EXAM: LEFT WRIST - COMPLETE 2 VIEW COMPARISON:  Films from earlier in the same day FINDINGS: There remains a comminuted fracture with impaction and posterior angulation at the fracture site. Only minimal reduction is noted. IMPRESSION: Minimal reduction of the known distal radial fracture. Electronically Signed   By: Inez Catalina M.D.   On: 07/02/2015 20:52   Dg Wrist Complete Left  07/02/2015  CLINICAL DATA:  Fall while walking dog with wrist pain and deformity, initial encounter EXAM: LEFT WRIST - COMPLETE 3+ VIEW COMPARISON:  None. FINDINGS: Comminuted fracture of the distal  left radius is noted with impaction and posterior angulation at the fracture site. Fracture extends into the radiocarpal articulation. No other fractures are noted. IMPRESSION: Comminuted distal radial fracture as described. Electronically Signed   By: Inez Catalina M.D.   On: 07/02/2015 18:46    Assessment & Plan:   Diagnoses and all orders for this visit:  Essential hypertension  Type 2 diabetes mellitus without complication, without long-term current use of insulin (HCC)  Anxiety and depression  Other orders -     metFORMIN (GLUCOPHAGE XR) 500 MG 24 hr tablet; Take 1 tablet (500 mg total) by mouth daily with breakfast.   I have discontinued Mr. Mizer's metFORMIN, naltrexone, and traMADol. I am also having him start on metFORMIN. Additionally, I am having him maintain his cholecalciferol, acetaminophen, ibuprofen, omeprazole, glucose blood, traZODone, buPROPion, hydrOXYzine, lipase/protease/amylase, tamsulosin, busPIRone, and losartan.  Meds ordered this encounter  Medications  . losartan (COZAAR) 50 MG tablet  Sig: Take 1 tablet by mouth daily.    Refill:  1  . metFORMIN (GLUCOPHAGE XR) 500 MG 24 hr tablet    Sig: Take 1 tablet (500 mg total) by mouth daily with breakfast.    Dispense:  90 tablet    Refill:  3     Follow-up: Return for a follow-up visit.  [name], MD

## 2015-10-04 NOTE — Assessment & Plan Note (Signed)
Change to metformin ER now 4/17 for better compliance

## 2015-10-04 NOTE — Assessment & Plan Note (Signed)
Losartan 

## 2015-10-04 NOTE — Progress Notes (Signed)
Pre visit review using our clinic review tool, if applicable. No additional management support is needed unless otherwise documented below in the visit note. 

## 2015-10-11 ENCOUNTER — Encounter: Payer: Self-pay | Admitting: Internal Medicine

## 2015-10-11 ENCOUNTER — Ambulatory Visit (INDEPENDENT_AMBULATORY_CARE_PROVIDER_SITE_OTHER): Payer: BLUE CROSS/BLUE SHIELD | Admitting: Internal Medicine

## 2015-10-11 VITALS — BP 126/78 | HR 96 | Ht 69.5 in | Wt 163.4 lb

## 2015-10-11 DIAGNOSIS — K858 Other acute pancreatitis without necrosis or infection: Secondary | ICD-10-CM

## 2015-10-11 NOTE — Patient Instructions (Signed)
Please follow up in one year 

## 2015-10-11 NOTE — Progress Notes (Signed)
HISTORY OF PRESENT ILLNESS:  Luke White is a 55 y.o. male with a history of chronic alcoholism, chronic alcoholic pancreatitis with recurrent flares, abnormal liver tests secondary to alcohol, and prior screening colonoscopy. He presents today for follow-up. Last evaluated 04/21/2015. See that dictation. He has been abstinent from alcohol for 9 months after completing successful rehabilitation program. He did undergo screening colonoscopy 04/29/2015. Moderate left-sided diverticulosis but otherwise negative. Follow-up in 10 years recommended. He has stopped pancreatic enzymes. No active GI complaints. He has recently moved to [place]. Does have diabetes for which she is on metformin. Last hemoglobin A1c 6.8. Follow-up blood work in October revealed normalization of LFTs. Mild elevation of lipase. Normal CBC. Most recent CBC did show platelet count of 100,000. Normal prothrombin time. No active complaints to report  REVIEW OF SYSTEMS:  All non-GI ROS (last 6 months)  negative except for anxiety, depression, fatigue, headaches, fevers, night sweats, sleeping problems, headaches, increased urination  Past Medical History  Diagnosis Date  . Anxiety   . GERD (gastroesophageal reflux disease)   . Allergic rhinitis   . Pancreatitis 2008    HX of  x2 ? cause  . Hypertension   . Depression   . Diabetes ([place])   . Hepatic steatosis   . Elevated LFTs   . Diverticulosis     Past Surgical History  Procedure Laterality Date  . Esophagogastroduodenoscopy endoscopy N/A 2012    Dr. Ardis Hughs    Social History Luke White  reports that he has quit smoking. His smoking use included Cigarettes. He has a 6.75 pack-year smoking history. He has never used smokeless tobacco. He reports that he does not drink alcohol or use illicit drugs.  family history includes Anxiety disorder in his other; COPD in his father; Diabetes in his maternal grandmother and maternal uncle; Hyperlipidemia in his father;  Hypertension in his father and mother. There is no history of Heart disease, Colon cancer, Esophageal cancer, Pancreatic cancer, Stomach cancer, Kidney disease, or Liver disease.  No Known Allergies     PHYSICAL EXAMINATION: Vital signs: BP 126/78 mmHg  Pulse 96  Ht 5' 9.5" (1.765 m)  Wt 163 lb 6 oz (74.106 kg)  BMI 23.79 kg/m2  Constitutional: generally well-appearing, no acute distress Psychiatric: alert and oriented x3, cooperative Eyes: extraocular movements intact, anicteric, conjunctiva pink Mouth: oral pharynx moist, no lesions Neck: supple no lymphadenopathy Cardiovascular: heart regular rate and rhythm, no murmur Lungs: clear to auscultation bilaterally Abdomen: soft, nontender, nondistended, no obvious ascites, no peritoneal signs, normal bowel sounds, no organomegaly Rectal:Omitted Extremities: no clubbing cyanosis or lower extremity edema bilaterally Skin: no lesions on visible extremities Neuro: No focal deficits. Normal DTRs. No asterixis.    ASSESSMENT:  #1. History of acute on chronic alcoholic pancreatitis. Currently asymptomatic #2. Chronic alcoholism. Abstinent for 9 months #3. Screening colonoscopy in November with diverticulosis only   PLAN:  #1. Continue tube state from alcohol #2. Routine GI follow-up one year #3. Surveillance colonoscopy 10 years #4. Ongoing general medical care with Dr. Alain White  15 minutes spent face-to-face with the patient. Greater than 50% a time use for counseling regarding his alcohol liver disease and pancreatitis

## 2015-10-20 ENCOUNTER — Other Ambulatory Visit: Payer: Self-pay | Admitting: Internal Medicine

## 2015-12-01 ENCOUNTER — Other Ambulatory Visit: Payer: Self-pay | Admitting: Internal Medicine

## 2015-12-15 ENCOUNTER — Other Ambulatory Visit: Payer: Self-pay | Admitting: Physician Assistant

## 2016-01-05 ENCOUNTER — Telehealth: Payer: Self-pay | Admitting: Internal Medicine

## 2016-01-05 DIAGNOSIS — Z1159 Encounter for screening for other viral diseases: Secondary | ICD-10-CM

## 2016-01-05 NOTE — Telephone Encounter (Signed)
Patient states MY Chart is requesting Hep C testing.  Can this be added to labs already in the system?

## 2016-01-06 NOTE — Telephone Encounter (Signed)
Ok Thx 

## 2016-01-09 NOTE — Telephone Encounter (Signed)
Notified pt MD place order for Hep C test.../lmb

## 2016-01-10 ENCOUNTER — Other Ambulatory Visit (INDEPENDENT_AMBULATORY_CARE_PROVIDER_SITE_OTHER): Payer: BLUE CROSS/BLUE SHIELD

## 2016-01-10 DIAGNOSIS — Z1159 Encounter for screening for other viral diseases: Secondary | ICD-10-CM

## 2016-01-10 DIAGNOSIS — E119 Type 2 diabetes mellitus without complications: Secondary | ICD-10-CM | POA: Diagnosis not present

## 2016-01-10 LAB — BASIC METABOLIC PANEL
BUN: 10 mg/dL (ref 6–23)
CALCIUM: 9.4 mg/dL (ref 8.4–10.5)
CO2: 30 mEq/L (ref 19–32)
Chloride: 100 mEq/L (ref 96–112)
Creatinine, Ser: 0.88 mg/dL (ref 0.40–1.50)
GFR: 95.59 mL/min (ref >60.00)
GLUCOSE: 278 mg/dL — AB (ref 70–99)
Potassium: 4.3 mEq/L (ref 3.5–5.1)
SODIUM: 136 meq/L (ref 135–145)

## 2016-01-10 LAB — HEMOGLOBIN A1C: HEMOGLOBIN A1C: 10.5 % — AB (ref 4.6–6.5)

## 2016-01-11 LAB — HEPATITIS PANEL, ACUTE
HCV Ab: REACTIVE — AB
HEP A IGM: NONREACTIVE
Hep B C IgM: NONREACTIVE
Hepatitis B Surface Ag: NEGATIVE

## 2016-01-13 ENCOUNTER — Encounter: Payer: Self-pay | Admitting: Internal Medicine

## 2016-01-13 ENCOUNTER — Ambulatory Visit (INDEPENDENT_AMBULATORY_CARE_PROVIDER_SITE_OTHER): Payer: BLUE CROSS/BLUE SHIELD | Admitting: Internal Medicine

## 2016-01-13 VITALS — BP 120/80 | HR 73 | Wt 164.0 lb

## 2016-01-13 DIAGNOSIS — F418 Other specified anxiety disorders: Secondary | ICD-10-CM | POA: Diagnosis not present

## 2016-01-13 DIAGNOSIS — E119 Type 2 diabetes mellitus without complications: Secondary | ICD-10-CM

## 2016-01-13 DIAGNOSIS — R768 Other specified abnormal immunological findings in serum: Secondary | ICD-10-CM

## 2016-01-13 DIAGNOSIS — F329 Major depressive disorder, single episode, unspecified: Secondary | ICD-10-CM

## 2016-01-13 DIAGNOSIS — I10 Essential (primary) hypertension: Secondary | ICD-10-CM | POA: Diagnosis not present

## 2016-01-13 DIAGNOSIS — F419 Anxiety disorder, unspecified: Secondary | ICD-10-CM

## 2016-01-13 DIAGNOSIS — F32A Depression, unspecified: Secondary | ICD-10-CM

## 2016-01-13 LAB — HEPATITIS C RNA QUANTITATIVE: HCV QUANT: NOT DETECTED [IU]/mL (ref <15)

## 2016-01-13 MED ORDER — EMPAGLIFLOZIN-LINAGLIPTIN 10-5 MG PO TABS: 1.0000 | ORAL_TABLET | Freq: Every day | ORAL | Status: DC

## 2016-01-13 NOTE — Progress Notes (Signed)
Pre visit review using our clinic review tool, if applicable. No additional management support is needed unless otherwise documented below in the visit note. 

## 2016-01-13 NOTE — Assessment & Plan Note (Signed)
Can take 1/2 Losartan

## 2016-01-13 NOTE — Patient Instructions (Signed)
Drink plenty of fluids

## 2016-01-13 NOTE — Progress Notes (Signed)
Subjective:  Patient ID: Luke White, male    DOB: [birth year]  Age: 55 y.o. MRN: [id number]  CC: Diabetes   HPI Luke White presents for DM, depression, chronic pancreatitis f/u. Luke White has moved to [place]. No ETOH. H/o Hep C false positive  Outpatient Prescriptions Prior to Visit  Medication Sig Dispense Refill  . acetaminophen (TYLENOL) 500 MG tablet Take 1,000 mg by mouth every 6 (six) hours as needed for moderate pain.    [name] buPROPion (WELLBUTRIN XL) 150 MG 24 hr tablet Take 150 mg by mouth daily.    . cholecalciferol (VITAMIN D) 1000 UNITS tablet Take 1 tablet (1,000 Units total) by mouth daily.    [name] glucose blood (BAYER CONTOUR NEXT TEST) test strip 1 each by Other route 2 (two) times daily. Use to check blood sugars twice a day Dx E11.9 100 each 3  . hydrOXYzine (ATARAX/VISTARIL) 25 MG tablet TAKE 1 TABLET BY MOUTH THREE TIMES DAILY AS NEEDED FOR ANXIETY 60 tablet 1  . ibuprofen (ADVIL,MOTRIN) 200 MG tablet Take 400 mg by mouth every 6 (six) hours as needed for moderate pain.    [name] lipase/protease/amylase (CREON) 36000 UNITS CPEP capsule Take 1 capsule (36,000 Units total) by mouth 3 (three) times daily before meals. 270 capsule 3  . losartan (COZAAR) 50 MG tablet Take 1 tablet by mouth daily.  1  . metFORMIN (GLUCOPHAGE XR) 500 MG 24 hr tablet Take 1 tablet (500 mg total) by mouth daily with breakfast. 90 tablet 3  . omeprazole (PRILOSEC) 40 MG capsule TAKE ONE CAPSULE BY MOUTH DAILY 90 capsule 3  . tamsulosin (FLOMAX) 0.4 MG CAPS capsule Take 1 capsule (0.4 mg total) by mouth daily. 30 capsule 11  . traZODone (DESYREL) 50 MG tablet TAKE 1 TABLET BY MOUTH EVERY NIGHT AT BEDTIME 30 tablet 5  . busPIRone (BUSPAR) 15 MG tablet TAKE 1 TABLET BY MOUTH TWICE DAILY (Patient not taking: Reported on 01/13/2016) 180 tablet 0   No facility-administered medications prior to visit.    ROS Review of Systems  Constitutional: Negative for appetite change, fatigue and unexpected weight  change.  HENT: Negative for congestion, nosebleeds, sneezing, sore throat and trouble swallowing.   Eyes: Negative for itching and visual disturbance.  Respiratory: Negative for cough.   Cardiovascular: Negative for chest pain, palpitations and leg swelling.  Gastrointestinal: Negative for nausea, diarrhea, blood in stool and abdominal distention.  Genitourinary: Positive for frequency. Negative for hematuria.  Musculoskeletal: Negative for back pain, joint swelling, gait problem and neck pain.  Skin: Negative for rash.  Neurological: Negative for dizziness, tremors, speech difficulty and weakness.  Psychiatric/Behavioral: Negative for suicidal ideas, sleep disturbance, dysphoric mood and agitation. The patient is not nervous/anxious.     Objective:  BP 120/80 mmHg  Pulse 73  Wt 164 lb (74.39 kg)  SpO2 98%  BP Readings from Last 3 Encounters:  01/13/16 120/80  10/11/15 126/78  10/04/15 130/84    Wt Readings from Last 3 Encounters:  01/13/16 164 lb (74.39 kg)  10/11/15 163 lb 6 oz (74.106 kg)  10/04/15 166 lb (75.297 kg)    Physical Exam  Constitutional: He is oriented to person, place, and time. He appears well-developed. No distress.  NAD  HENT:  Mouth/Throat: Oropharynx is clear and moist.  Eyes: Conjunctivae are normal. Pupils are equal, round, and reactive to light.  Neck: Normal range of motion. No JVD present. No thyromegaly present.  Cardiovascular: Normal rate, regular rhythm, normal heart sounds and intact  distal pulses.  Exam reveals no gallop and no friction rub.   No murmur heard. Pulmonary/Chest: Effort normal and breath sounds normal. No respiratory distress. He has no wheezes. He has no rales. He exhibits no tenderness.  Abdominal: Soft. Bowel sounds are normal. He exhibits no distension and no mass. There is no tenderness. There is no rebound and no guarding.  Musculoskeletal: Normal range of motion. He exhibits no edema or tenderness.  Lymphadenopathy:     He has no cervical adenopathy.  Neurological: He is alert and oriented to person, place, and time. He has normal reflexes. No cranial nerve deficit. He exhibits normal muscle tone. He displays a negative Romberg sign. Coordination and gait normal.  Skin: Skin is warm and dry. No rash noted.  Psychiatric: He has a normal mood and affect. His behavior is normal. Judgment and thought content normal.  looks well  Lab Results  Component Value Date   WBC 7.1 07/08/2015   HGB 13.7 07/08/2015   HCT 41.4 07/08/2015   PLT 100.0* 07/08/2015   GLUCOSE 278* 01/10/2016   CHOL 213* 03/15/2014   TRIG 129.0 03/15/2014   HDL 53.60 03/15/2014   LDLDIRECT 149.4 01/12/2011   LDLCALC 134* 03/15/2014   ALT 15 07/08/2015   AST 20 07/08/2015   NA 136 01/10/2016   K 4.3 01/10/2016   CL 100 01/10/2016   CREATININE 0.88 01/10/2016   BUN 10 01/10/2016   CO2 30 01/10/2016   TSH 2.21 03/15/2014   PSA 0.60 07/08/2015   INR 1.0 04/21/2015   HGBA1C 10.5* 01/10/2016    Dg Wrist Complete Left  07/02/2015  CLINICAL DATA:  Status post reduction EXAM: LEFT WRIST - COMPLETE 2 VIEW COMPARISON:  Films from earlier in the same day FINDINGS: There remains a comminuted fracture with impaction and posterior angulation at the fracture site. Only minimal reduction is noted. IMPRESSION: Minimal reduction of the known distal radial fracture. Electronically Signed   By: Inez Catalina M.D.   On: 07/02/2015 20:52   Dg Wrist Complete Left  07/02/2015  CLINICAL DATA:  Fall while walking dog with wrist pain and deformity, initial encounter EXAM: LEFT WRIST - COMPLETE 3+ VIEW COMPARISON:  None. FINDINGS: Comminuted fracture of the distal left radius is noted with impaction and posterior angulation at the fracture site. Fracture extends into the radiocarpal articulation. No other fractures are noted. IMPRESSION: Comminuted distal radial fracture as described. Electronically Signed   By: Inez Catalina M.D.   On: 07/02/2015 18:46     Assessment & Plan:   There are no diagnoses linked to this encounter. I am having Mr. Gorum maintain his cholecalciferol, acetaminophen, ibuprofen, omeprazole, glucose blood, buPROPion, hydrOXYzine, lipase/protease/amylase, tamsulosin, busPIRone, losartan, metFORMIN, and traZODone.  No orders of the defined types were placed in this encounter.     Follow-up: No Follow-up on file.  [name], MD

## 2016-01-13 NOTE — Assessment & Plan Note (Addendum)
Worse Possible development of IDDM was discussed Metformin XR Add Glyxambi  Potential benefits of a long term Metformin, Glyxambi use as well as potential risks  and complications were explained to the patient and were aknowledged. No ETOH

## 2016-01-14 DIAGNOSIS — R768 Other specified abnormal immunological findings in serum: Secondary | ICD-10-CM | POA: Insufficient documentation

## 2016-01-14 NOTE — Assessment & Plan Note (Signed)
Recurrent  

## 2016-01-14 NOTE — Assessment & Plan Note (Signed)
On Trazodone 

## 2016-01-31 ENCOUNTER — Other Ambulatory Visit (INDEPENDENT_AMBULATORY_CARE_PROVIDER_SITE_OTHER): Payer: BLUE CROSS/BLUE SHIELD

## 2016-01-31 ENCOUNTER — Telehealth: Payer: Self-pay | Admitting: Internal Medicine

## 2016-01-31 DIAGNOSIS — R739 Hyperglycemia, unspecified: Secondary | ICD-10-CM

## 2016-01-31 LAB — BASIC METABOLIC PANEL
BUN: 13 mg/dL (ref 6–23)
CALCIUM: 9.8 mg/dL (ref 8.4–10.5)
CO2: 28 mEq/L (ref 19–32)
CREATININE: 0.93 mg/dL (ref 0.40–1.50)
Chloride: 101 mEq/L (ref 96–112)
GFR: 89.66 mL/min (ref >60.00)
Glucose, Bld: 198 mg/dL — ABNORMAL HIGH (ref 70–99)
POTASSIUM: 4.5 meq/L (ref 3.5–5.1)
SODIUM: 136 meq/L (ref 135–145)

## 2016-01-31 LAB — HEMOGLOBIN A1C: Hgb A1c MFr Bld: 10.1 % — ABNORMAL HIGH (ref 4.6–6.5)

## 2016-01-31 NOTE — Telephone Encounter (Signed)
Labs

## 2016-02-02 ENCOUNTER — Encounter: Payer: Self-pay | Admitting: Internal Medicine

## 2016-02-02 ENCOUNTER — Ambulatory Visit (INDEPENDENT_AMBULATORY_CARE_PROVIDER_SITE_OTHER): Payer: BLUE CROSS/BLUE SHIELD | Admitting: Internal Medicine

## 2016-02-02 DIAGNOSIS — I10 Essential (primary) hypertension: Secondary | ICD-10-CM | POA: Diagnosis not present

## 2016-02-02 DIAGNOSIS — E119 Type 2 diabetes mellitus without complications: Secondary | ICD-10-CM | POA: Diagnosis not present

## 2016-02-02 DIAGNOSIS — K860 Alcohol-induced chronic pancreatitis: Secondary | ICD-10-CM

## 2016-02-02 MED ORDER — INSULIN DEGLUDEC 100 UNIT/ML ~~LOC~~ SOPN: 10.0000 [IU] | PEN_INJECTOR | Freq: Every day | SUBCUTANEOUS | 11 refills | Status: DC

## 2016-02-02 MED ORDER — INSULIN PEN NEEDLE 31G X 5 MM MISC: 1.0000 [IU] | Freq: Four times a day (QID) | 11 refills | Status: AC

## 2016-02-02 MED ORDER — INSULIN PEN NEEDLE 31G X 5 MM MISC
1.0000 [IU] | Freq: Four times a day (QID) | 11 refills | Status: DC
Start: 2016-02-02 — End: 2016-02-02

## 2016-02-02 NOTE — Patient Instructions (Addendum)
Start Tresiba 4 units a day. Titrate by +1 unit every 1-2 days for goal sugars of 100-140  Hold Glyxambi

## 2016-02-02 NOTE — Assessment & Plan Note (Signed)
No relapse Creon

## 2016-02-02 NOTE — Progress Notes (Signed)
Pre visit review using our clinic review tool, if applicable. No additional management support is needed unless otherwise documented below in the visit note. 

## 2016-02-02 NOTE — Assessment & Plan Note (Addendum)
Options to treat discussed: Start Tresiba 4 units a day. Titrate by +1 unit every 1-2 days for goal sugars of 100-140  D/c Glyxambi. Cont Metformin for now Endo ref at [facility]

## 2016-02-02 NOTE — Assessment & Plan Note (Signed)
Losarta. BP Readings from Last 3 Encounters:  02/02/16 130/80  01/13/16 120/80  10/11/15 126/78

## 2016-02-02 NOTE — Progress Notes (Signed)
Subjective:  Patient ID: Luke White, male    DOB: May 07, 1961  Age: 55 y.o. MRN: [id number]  CC: No chief complaint on file.   HPI Luke White presents for DM f/u. CBGs are 170-250  Outpatient Medications Prior to Visit  Medication Sig Dispense Refill  . acetaminophen (TYLENOL) 500 MG tablet Take 1,000 mg by mouth every 6 (six) hours as needed for moderate pain.    [name] buPROPion (WELLBUTRIN XL) 150 MG 24 hr tablet Take 150 mg by mouth daily.    . busPIRone (BUSPAR) 15 MG tablet TAKE 1 TABLET BY MOUTH TWICE DAILY 180 tablet 0  . cholecalciferol (VITAMIN D) 1000 UNITS tablet Take 1 tablet (1,000 Units total) by mouth daily.    . Empagliflozin-Linagliptin (GLYXAMBI) 10-5 MG TABS Take 1 tablet by mouth daily. 30 tablet 11  . glucose blood (BAYER CONTOUR NEXT TEST) test strip 1 each by Other route 2 (two) times daily. Use to check blood sugars twice a day Dx E11.9 100 each 3  . hydrOXYzine (ATARAX/VISTARIL) 25 MG tablet TAKE 1 TABLET BY MOUTH THREE TIMES DAILY AS NEEDED FOR ANXIETY 60 tablet 1  . ibuprofen (ADVIL,MOTRIN) 200 MG tablet Take 400 mg by mouth every 6 (six) hours as needed for moderate pain.    [name] lipase/protease/amylase (CREON) 36000 UNITS CPEP capsule Take 1 capsule (36,000 Units total) by mouth 3 (three) times daily before meals. 270 capsule 3  . losartan (COZAAR) 50 MG tablet Take 1 tablet by mouth daily.  1  . metFORMIN (GLUCOPHAGE XR) 500 MG 24 hr tablet Take 1 tablet (500 mg total) by mouth daily with breakfast. 90 tablet 3  . omeprazole (PRILOSEC) 40 MG capsule TAKE ONE CAPSULE BY MOUTH DAILY 90 capsule 3  . tamsulosin (FLOMAX) 0.4 MG CAPS capsule Take 1 capsule (0.4 mg total) by mouth daily. 30 capsule 11  . traZODone (DESYREL) 50 MG tablet TAKE 1 TABLET BY MOUTH EVERY NIGHT AT BEDTIME 30 tablet 5   No facility-administered medications prior to visit.     ROS Review of Systems  Constitutional: Negative for appetite change, fatigue and unexpected weight change.    HENT: Negative for congestion, nosebleeds, sneezing, sore throat and trouble swallowing.   Eyes: Negative for itching and visual disturbance.  Respiratory: Negative for cough.   Cardiovascular: Negative for chest pain, palpitations and leg swelling.  Gastrointestinal: Negative for abdominal distention, blood in stool, diarrhea and nausea.  Genitourinary: Positive for frequency. Negative for hematuria.  Musculoskeletal: Negative for back pain, gait problem, joint swelling and neck pain.  Skin: Negative for rash.  Neurological: Negative for dizziness, tremors, speech difficulty and weakness.  Psychiatric/Behavioral: Negative for agitation, dysphoric mood and sleep disturbance. The patient is not nervous/anxious.     Objective:  BP 130/80   Pulse 77   Wt 162 lb (73.5 kg)   SpO2 98%   BMI 23.58 kg/m   BP Readings from Last 3 Encounters:  02/02/16 130/80  01/13/16 120/80  10/11/15 126/78    Wt Readings from Last 3 Encounters:  02/02/16 162 lb (73.5 kg)  01/13/16 164 lb (74.4 kg)  10/11/15 163 lb 6 oz (74.1 kg)    Physical Exam  Constitutional: He is oriented to person, place, and time. He appears well-developed. No distress.  NAD  HENT:  Mouth/Throat: Oropharynx is clear and moist.  Eyes: Conjunctivae are normal. Pupils are equal, round, and reactive to Luke.  Neck: Normal range of motion. No JVD present. No thyromegaly present.  Cardiovascular: Normal rate, regular rhythm, normal heart sounds and intact distal pulses.  Exam reveals no gallop and no friction rub.   No murmur heard. Pulmonary/Chest: Effort normal and breath sounds normal. No respiratory distress. He has no wheezes. He has no rales. He exhibits no tenderness.  Abdominal: Soft. Bowel sounds are normal. He exhibits no distension and no mass. There is no tenderness. There is no rebound and no guarding.  Musculoskeletal: Normal range of motion. He exhibits no edema or tenderness.  Lymphadenopathy:    He has no  cervical adenopathy.  Neurological: He is alert and oriented to person, place, and time. He has normal reflexes. No cranial nerve deficit. He exhibits normal muscle tone. He displays a negative Romberg sign. Coordination and gait normal.  Skin: Skin is warm and dry. No rash noted.  Psychiatric: He has a normal mood and affect. His behavior is normal. Judgment and thought content normal.    >20 min  Lab Results  Component Value Date   WBC 7.1 07/08/2015   HGB 13.7 07/08/2015   HCT 41.4 07/08/2015   PLT 100.0 (L) 07/08/2015   GLUCOSE 198 (H) 01/31/2016   CHOL 213 (H) 03/15/2014   TRIG 129.0 03/15/2014   HDL 53.60 03/15/2014   LDLDIRECT 149.4 01/12/2011   LDLCALC 134 (H) 03/15/2014   ALT 15 07/08/2015   AST 20 07/08/2015   NA 136 01/31/2016   K 4.5 01/31/2016   CL 101 01/31/2016   CREATININE 0.93 01/31/2016   BUN 13 01/31/2016   CO2 28 01/31/2016   TSH 2.21 03/15/2014   PSA 0.60 07/08/2015   INR 1.0 04/21/2015   HGBA1C 10.1 (H) 01/31/2016    Dg Wrist Complete Left  Result Date: 07/02/2015 CLINICAL DATA:  Status post reduction EXAM: LEFT WRIST - COMPLETE 2 VIEW COMPARISON:  Films from earlier in the same day FINDINGS: There remains a comminuted fracture with impaction and posterior angulation at the fracture site. Only minimal reduction is noted. IMPRESSION: Minimal reduction of the known distal radial fracture. Electronically Signed   By: Inez Catalina M.D.   On: 07/02/2015 20:52   Dg Wrist Complete Left  Result Date: 07/02/2015 CLINICAL DATA:  Fall while walking dog with wrist pain and deformity, initial encounter EXAM: LEFT WRIST - COMPLETE 3+ VIEW COMPARISON:  None. FINDINGS: Comminuted fracture of the distal left radius is noted with impaction and posterior angulation at the fracture site. Fracture extends into the radiocarpal articulation. No other fractures are noted. IMPRESSION: Comminuted distal radial fracture as described. Electronically Signed   By: Inez Catalina M.D.    On: 07/02/2015 18:46    Assessment & Plan:   There are no diagnoses linked to this encounter. I am having Mr. Mangine maintain his cholecalciferol, acetaminophen, ibuprofen, omeprazole, glucose blood, buPROPion, hydrOXYzine, lipase/protease/amylase, tamsulosin, busPIRone, losartan, metFORMIN, traZODone, and Empagliflozin-Linagliptin.  No orders of the defined types were placed in this encounter.    Follow-up: No Follow-up on file.  [name], MD

## 2016-02-03 ENCOUNTER — Ambulatory Visit: Payer: Self-pay | Admitting: Internal Medicine

## 2016-02-07 IMAGING — DX DG WRIST COMPLETE 3+V*L*
3 series · 3 of 3 positions shown · non-contrast
Comparison: None.

CLINICAL DATA: Fall while walking dog with wrist pain and
deformity, initial encounter

EXAM:
LEFT WRIST - COMPLETE 3+ VIEW

[x wrist pa left]
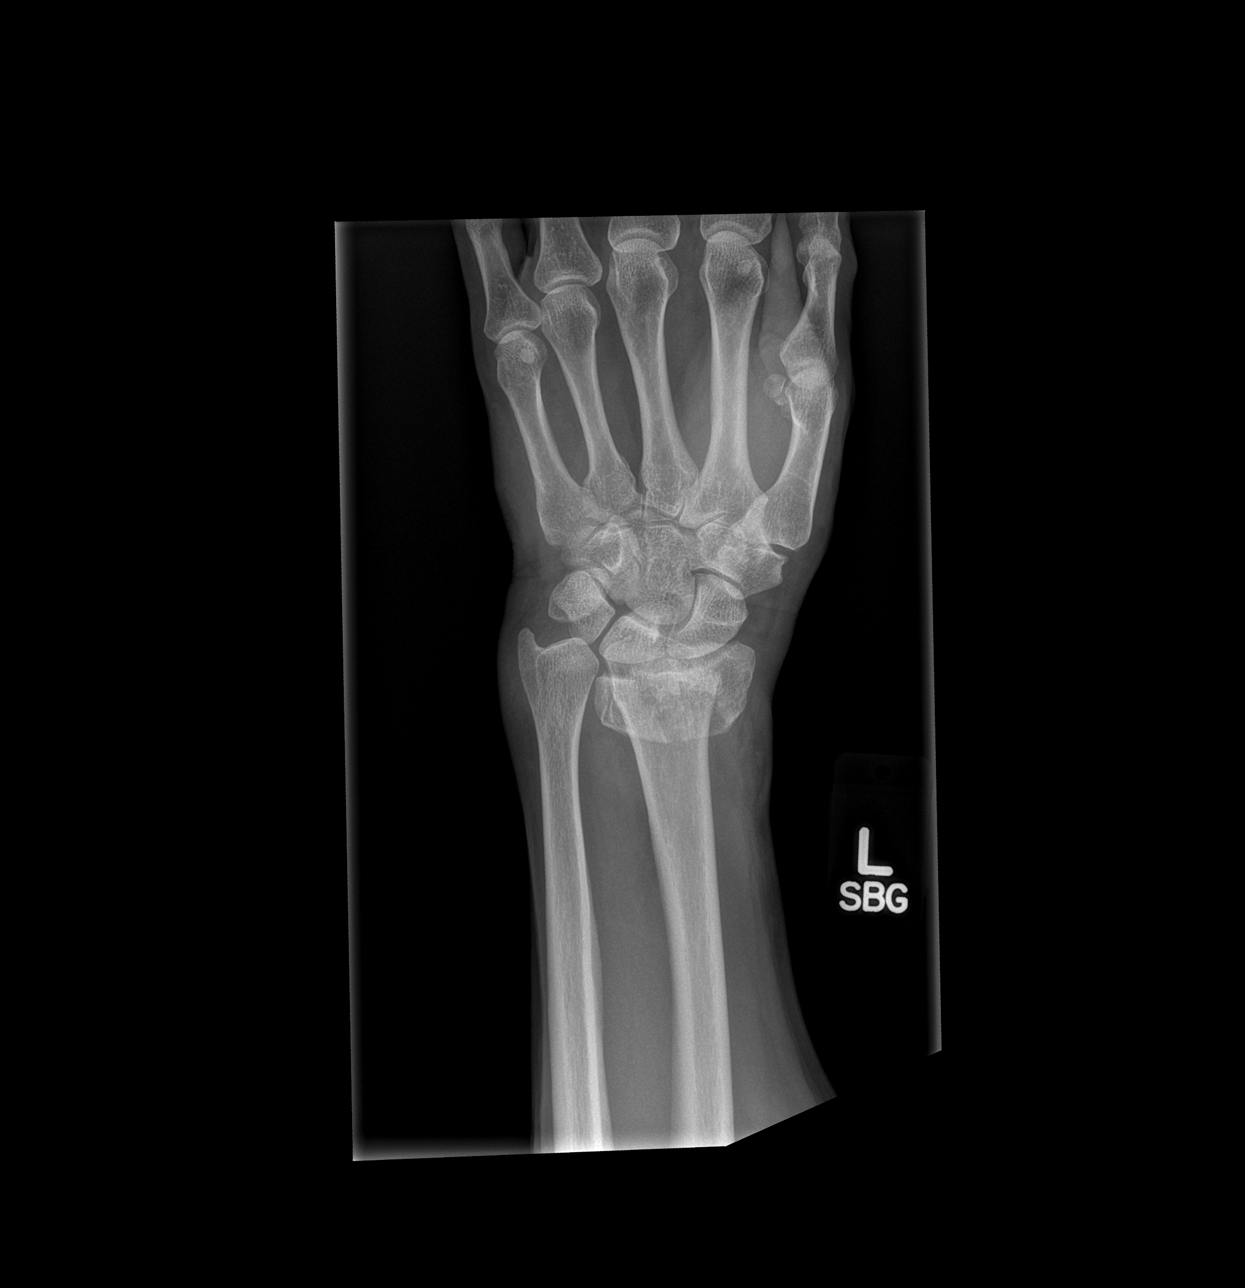

[x wrist obl left]
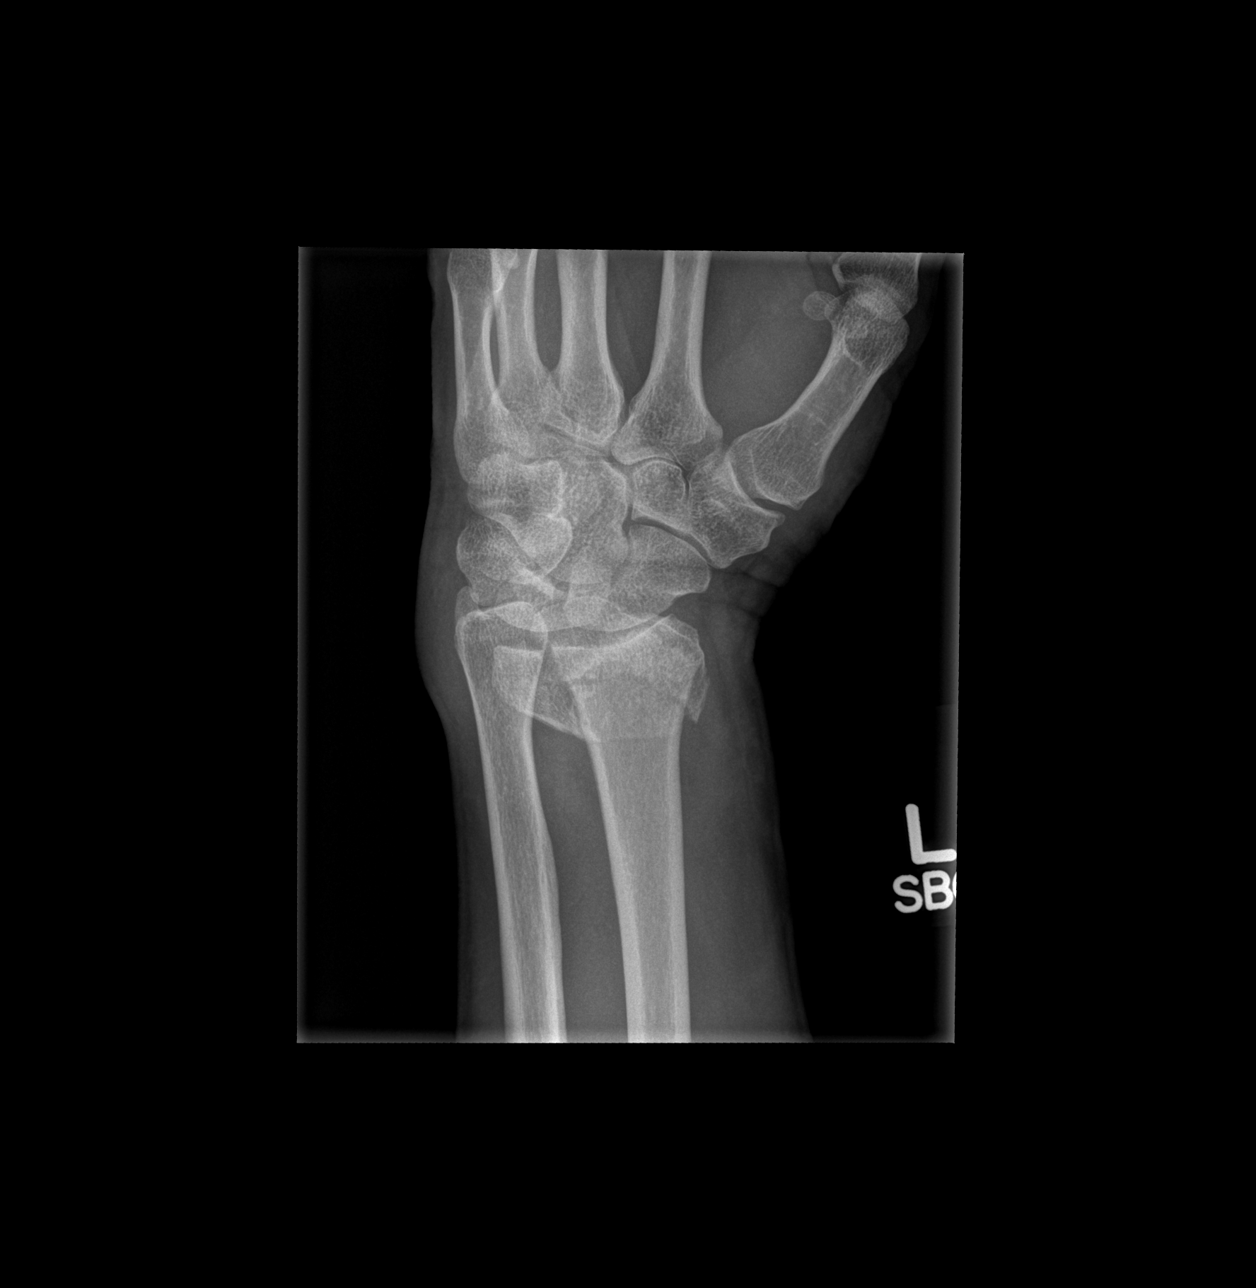

[x wrist lat left]
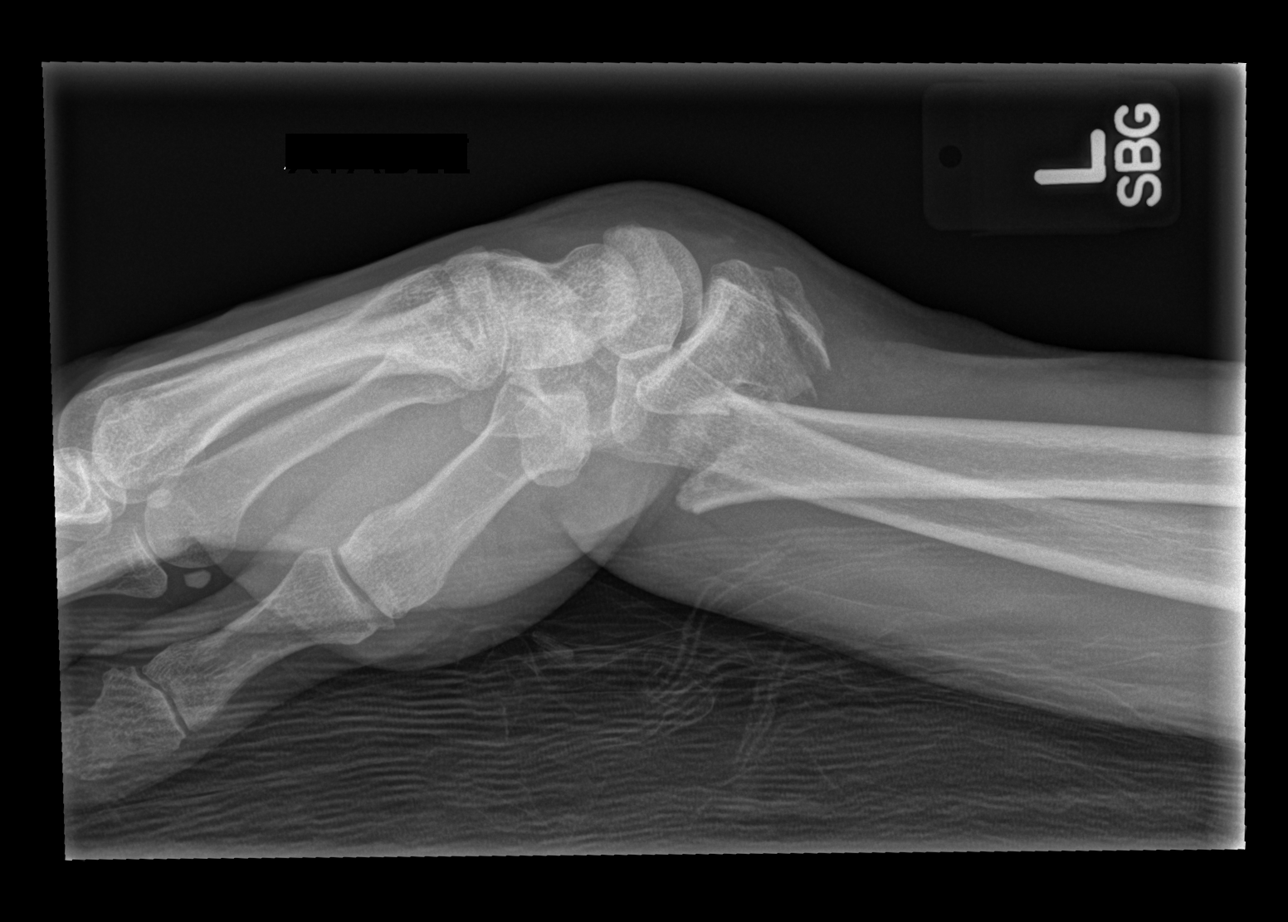

[3 of 3 positions shown; findings below may reference images not displayed]

FINDINGS: Comminuted fracture of the distal left radius is noted with
impaction and posterior angulation at the fracture site. Fracture
extends into the radiocarpal articulation. No other fractures are
noted.
IMPRESSION: Comminuted distal radial fracture as described.

## 2016-03-19 ENCOUNTER — Other Ambulatory Visit: Payer: Self-pay | Admitting: Internal Medicine

## 2016-04-13 ENCOUNTER — Other Ambulatory Visit: Payer: Self-pay | Admitting: Internal Medicine

## 2016-05-01 ENCOUNTER — Other Ambulatory Visit: Payer: Self-pay | Admitting: Internal Medicine

## 2016-05-02 ENCOUNTER — Other Ambulatory Visit: Payer: Self-pay | Admitting: *Deleted

## 2016-05-02 MED ORDER — GLUCOSE BLOOD VI STRP: 1.0000 | ORAL_STRIP | Freq: Two times a day (BID) | 3 refills | Status: AC

## 2016-05-08 ENCOUNTER — Ambulatory Visit: Payer: Self-pay | Admitting: Internal Medicine

## 2016-05-28 ENCOUNTER — Other Ambulatory Visit: Payer: Self-pay | Admitting: Internal Medicine

## 2016-07-06 ENCOUNTER — Other Ambulatory Visit: Payer: Self-pay | Admitting: Internal Medicine

## 2016-08-09 ENCOUNTER — Other Ambulatory Visit (INDEPENDENT_AMBULATORY_CARE_PROVIDER_SITE_OTHER): Payer: BLUE CROSS/BLUE SHIELD

## 2016-08-09 DIAGNOSIS — E119 Type 2 diabetes mellitus without complications: Secondary | ICD-10-CM | POA: Diagnosis not present

## 2016-08-09 LAB — BASIC METABOLIC PANEL
BUN: 10 mg/dL (ref 6–23)
CO2: 29 mEq/L (ref 19–32)
Calcium: 9.5 mg/dL (ref 8.4–10.5)
Chloride: 102 mEq/L (ref 96–112)
Creatinine, Ser: 0.71 mg/dL (ref 0.40–1.50)
GFR: 122.2 mL/min (ref >60.00)
Glucose, Bld: 174 mg/dL — ABNORMAL HIGH (ref 70–99)
POTASSIUM: 4.5 meq/L (ref 3.5–5.1)
Sodium: 136 mEq/L (ref 135–145)

## 2016-08-14 ENCOUNTER — Encounter: Payer: Self-pay | Admitting: Internal Medicine

## 2016-08-14 ENCOUNTER — Other Ambulatory Visit: Payer: Self-pay | Admitting: Internal Medicine

## 2016-08-14 ENCOUNTER — Ambulatory Visit (INDEPENDENT_AMBULATORY_CARE_PROVIDER_SITE_OTHER): Payer: BLUE CROSS/BLUE SHIELD | Admitting: Internal Medicine

## 2016-08-14 DIAGNOSIS — I10 Essential (primary) hypertension: Secondary | ICD-10-CM | POA: Diagnosis not present

## 2016-08-14 DIAGNOSIS — G4700 Insomnia, unspecified: Secondary | ICD-10-CM | POA: Diagnosis not present

## 2016-08-14 DIAGNOSIS — K219 Gastro-esophageal reflux disease without esophagitis: Secondary | ICD-10-CM

## 2016-08-14 DIAGNOSIS — E119 Type 2 diabetes mellitus without complications: Secondary | ICD-10-CM

## 2016-08-14 MED ORDER — TRAZODONE HCL 50 MG PO TABS: 50.0000 mg | ORAL_TABLET | Freq: Every day | ORAL | 1 refills | Status: DC

## 2016-08-14 MED ORDER — OMEPRAZOLE 40 MG PO CPDR: 40.0000 mg | DELAYED_RELEASE_CAPSULE | Freq: Every day | ORAL | 3 refills | Status: DC

## 2016-08-14 MED ORDER — LOSARTAN POTASSIUM 50 MG PO TABS: 50.0000 mg | ORAL_TABLET | Freq: Every day | ORAL | 3 refills | Status: DC

## 2016-08-14 MED ORDER — BUPROPION HCL ER (XL) 150 MG PO TB24: 150.0000 mg | ORAL_TABLET | Freq: Every morning | ORAL | 1 refills | Status: DC

## 2016-08-14 NOTE — Assessment & Plan Note (Signed)
On Tresiba, Insulin

## 2016-08-14 NOTE — Assessment & Plan Note (Signed)
Doing well on diet ?

## 2016-08-14 NOTE — Progress Notes (Signed)
Pre-visit discussion using our clinic review tool. No additional management support is needed unless otherwise documented below in the visit note.  

## 2016-08-14 NOTE — Progress Notes (Signed)
Subjective:  Patient ID: Luke White, male    DOB: 20-Feb-1961  Age: 56 y.o. MRN: [id number]  CC: Follow-up (DM type 2, HTN, anxiety)   HPI Luke White presents for depression, insomnia, pancreatitis f/u  Outpatient Medications Prior to Visit  Medication Sig Dispense Refill  . acetaminophen (TYLENOL) 500 MG tablet Take 1,000 mg by mouth every 6 (six) hours as needed for moderate pain.    [name] buPROPion (WELLBUTRIN XL) 150 MG 24 hr tablet TAKE 1 TABLET BY MOUTH EVERY MORNING 90 tablet 2  . cholecalciferol (VITAMIN D) 1000 UNITS tablet Take 1 tablet (1,000 Units total) by mouth daily.    [name] glucose blood (BAYER CONTOUR NEXT TEST) test strip 1 each by Other route 2 (two) times daily. Use to check blood sugars twice a day Dx E11.9 100 each 3  . ibuprofen (ADVIL,MOTRIN) 200 MG tablet Take 400 mg by mouth every 6 (six) hours as needed for moderate pain.    . Insulin Degludec (TRESIBA FLEXTOUCH) 100 UNIT/ML SOPN Inject 10 Units into the skin daily. (Patient taking differently: Inject 40 Units into the skin every morning. ) 9 mL 11  . Insulin Pen Needle 31G X 5 MM MISC 1 Units by Does not apply route 4 (four) times daily. 100 each 11  . losartan (COZAAR) 50 MG tablet Take 1 tablet by mouth daily.  1  . omeprazole (PRILOSEC) 40 MG capsule TAKE ONE CAPSULE BY MOUTH DAILY 90 capsule 0  . tamsulosin (FLOMAX) 0.4 MG CAPS capsule Take 1 capsule (0.4 mg total) by mouth daily. 30 capsule 11  . traZODone (DESYREL) 50 MG tablet TAKE 1 TABLET BY MOUTH EVERY NIGHT AT BEDTIME 30 tablet 0  . buPROPion (WELLBUTRIN XL) 150 MG 24 hr tablet Take 150 mg by mouth daily.    . busPIRone (BUSPAR) 15 MG tablet TAKE 1 TABLET BY MOUTH TWICE DAILY 180 tablet 0  . hydrOXYzine (ATARAX/VISTARIL) 25 MG tablet TAKE 1 TABLET BY MOUTH THREE TIMES DAILY AS NEEDED FOR ANXIETY 60 tablet 1  . lipase/protease/amylase (CREON) 36000 UNITS CPEP capsule Take 1 capsule (36,000 Units total) by mouth 3 (three) times daily before meals. 270  capsule 3  . metFORMIN (GLUCOPHAGE XR) 500 MG 24 hr tablet Take 1 tablet (500 mg total) by mouth daily with breakfast. 90 tablet 3   No facility-administered medications prior to visit.     ROS Review of Systems  Constitutional: Negative for appetite change, fatigue and unexpected weight change.  HENT: Negative for congestion, nosebleeds, sneezing, sore throat and trouble swallowing.   Eyes: Negative for itching and visual disturbance.  Respiratory: Negative for cough.   Cardiovascular: Negative for chest pain, palpitations and leg swelling.  Gastrointestinal: Negative for abdominal distention, blood in stool, diarrhea and nausea.  Genitourinary: Negative for frequency and hematuria.  Musculoskeletal: Negative for back pain, gait problem, joint swelling and neck pain.  Skin: Negative for rash.  Neurological: Negative for dizziness, tremors, speech difficulty and weakness.  Psychiatric/Behavioral: Negative for agitation, dysphoric mood, sleep disturbance and suicidal ideas. The patient is nervous/anxious.     Objective:  BP 126/84   Pulse 79   Temp 98 F (36.7 C) (Oral)   Resp 16   Ht 5\' 10"  (1.778 m)   Wt 180 lb 8 oz (81.9 kg)   SpO2 98%   BMI 25.90 kg/m   BP Readings from Last 3 Encounters:  08/14/16 126/84  02/02/16 130/80  01/13/16 120/80    Wt Readings from Last  3 Encounters:  08/14/16 180 lb 8 oz (81.9 kg)  02/02/16 162 lb (73.5 kg)  01/13/16 164 lb (74.4 kg)    Physical Exam  Constitutional: He is oriented to person, place, and time. He appears well-developed. No distress.  NAD  HENT:  Mouth/Throat: Oropharynx is clear and moist.  Eyes: Conjunctivae are normal. Pupils are equal, round, and reactive to light.  Neck: Normal range of motion. No JVD present. No thyromegaly present.  Cardiovascular: Normal rate, regular rhythm, normal heart sounds and intact distal pulses.  Exam reveals no gallop and no friction rub.   No murmur heard. Pulmonary/Chest: Effort  normal and breath sounds normal. No respiratory distress. He has no wheezes. He has no rales. He exhibits no tenderness.  Abdominal: Soft. Bowel sounds are normal. He exhibits no distension and no mass. There is no tenderness. There is no rebound and no guarding.  Musculoskeletal: Normal range of motion. He exhibits tenderness. He exhibits no edema.  Lymphadenopathy:    He has no cervical adenopathy.  Neurological: He is alert and oriented to person, place, and time. He has normal reflexes. No cranial nerve deficit. He exhibits normal muscle tone. He displays a negative Romberg sign. Coordination and gait normal.  Skin: Skin is warm and dry. No rash noted.  Psychiatric: He has a normal mood and affect. His behavior is normal. Judgment and thought content normal.    Lab Results  Component Value Date   WBC 7.1 07/08/2015   HGB 13.7 07/08/2015   HCT 41.4 07/08/2015   PLT 100.0 (L) 07/08/2015   GLUCOSE 174 (H) 08/09/2016   CHOL 213 (H) 03/15/2014   TRIG 129.0 03/15/2014   HDL 53.60 03/15/2014   LDLDIRECT 149.4 01/12/2011   LDLCALC 134 (H) 03/15/2014   ALT 15 07/08/2015   AST 20 07/08/2015   NA 136 08/09/2016   K 4.5 08/09/2016   CL 102 08/09/2016   CREATININE 0.71 08/09/2016   BUN 10 08/09/2016   CO2 29 08/09/2016   TSH 2.21 03/15/2014   PSA 0.60 07/08/2015   INR 1.0 04/21/2015   HGBA1C 10.1 (H) 01/31/2016    Dg Wrist Complete Left  Result Date: 07/02/2015 CLINICAL DATA:  Status post reduction EXAM: LEFT WRIST - COMPLETE 2 VIEW COMPARISON:  Films from earlier in the same day FINDINGS: There remains a comminuted fracture with impaction and posterior angulation at the fracture site. Only minimal reduction is noted. IMPRESSION: Minimal reduction of the known distal radial fracture. Electronically Signed   By: Inez Catalina M.D.   On: 07/02/2015 20:52   Dg Wrist Complete Left  Result Date: 07/02/2015 CLINICAL DATA:  Fall while walking dog with wrist pain and deformity, initial  encounter EXAM: LEFT WRIST - COMPLETE 3+ VIEW COMPARISON:  None. FINDINGS: Comminuted fracture of the distal left radius is noted with impaction and posterior angulation at the fracture site. Fracture extends into the radiocarpal articulation. No other fractures are noted. IMPRESSION: Comminuted distal radial fracture as described. Electronically Signed   By: Inez Catalina M.D.   On: 07/02/2015 18:46    Assessment & Plan:   There are no diagnoses linked to this encounter. I have discontinued Mr. Castillo's hydrOXYzine, lipase/protease/amylase, busPIRone, and metFORMIN. I am also having him maintain his cholecalciferol, acetaminophen, ibuprofen, tamsulosin, losartan, insulin degludec, Insulin Pen Needle, buPROPion, glucose blood, traZODone, omeprazole, and NOVOLOG FLEXPEN.  Meds ordered this encounter  Medications  . NOVOLOG FLEXPEN 100 UNIT/ML FlexPen    Sig: Inject 30 Units into the skin  every evening. Before meals    Refill:  2     Follow-up: No Follow-up on file.  [name], MD

## 2016-08-14 NOTE — Assessment & Plan Note (Signed)
Trazodone at hs 

## 2016-08-14 NOTE — Assessment & Plan Note (Signed)
Losartan 

## 2016-11-29 ENCOUNTER — Other Ambulatory Visit: Payer: Self-pay | Admitting: Internal Medicine

## 2016-11-29 ENCOUNTER — Encounter: Payer: Self-pay | Admitting: Internal Medicine

## 2016-11-29 MED ORDER — PANCRELIPASE (LIP-PROT-AMYL) 36000-114000 UNITS PO CPEP: 36000.0000 [IU] | ORAL_CAPSULE | Freq: Three times a day (TID) | ORAL | 3 refills | Status: DC

## 2016-11-29 NOTE — Telephone Encounter (Signed)
Per chart Creon has been discontinued. pls advise...Luke White

## 2016-12-03 ENCOUNTER — Telehealth: Payer: Self-pay | Admitting: Internal Medicine

## 2016-12-03 NOTE — Telephone Encounter (Signed)
Would like to know if you received a fax for a PA on the Pts Creon   Reference Number [id number]

## 2016-12-04 NOTE — Telephone Encounter (Signed)
PA approved, pharmacy notified.

## 2017-01-30 ENCOUNTER — Other Ambulatory Visit (INDEPENDENT_AMBULATORY_CARE_PROVIDER_SITE_OTHER): Payer: BLUE CROSS/BLUE SHIELD

## 2017-01-30 DIAGNOSIS — E119 Type 2 diabetes mellitus without complications: Secondary | ICD-10-CM

## 2017-01-30 LAB — BASIC METABOLIC PANEL
BUN: 17 mg/dL (ref 6–23)
CHLORIDE: 101 meq/L (ref 96–112)
CO2: 26 meq/L (ref 19–32)
Calcium: 9.4 mg/dL (ref 8.4–10.5)
Creatinine, Ser: 0.93 mg/dL (ref 0.40–1.50)
GFR: 89.34 mL/min (ref >60.00)
Glucose, Bld: 240 mg/dL — ABNORMAL HIGH (ref 70–99)
POTASSIUM: 4.3 meq/L (ref 3.5–5.1)
SODIUM: 135 meq/L (ref 135–145)

## 2017-02-12 ENCOUNTER — Other Ambulatory Visit: Payer: Self-pay | Admitting: Internal Medicine

## 2017-02-13 ENCOUNTER — Encounter: Payer: Self-pay | Admitting: Internal Medicine

## 2017-02-13 ENCOUNTER — Ambulatory Visit (INDEPENDENT_AMBULATORY_CARE_PROVIDER_SITE_OTHER): Payer: BLUE CROSS/BLUE SHIELD | Admitting: Internal Medicine

## 2017-02-13 ENCOUNTER — Other Ambulatory Visit: Payer: Self-pay | Admitting: *Deleted

## 2017-02-13 DIAGNOSIS — F332 Major depressive disorder, recurrent severe without psychotic features: Secondary | ICD-10-CM | POA: Diagnosis not present

## 2017-02-13 DIAGNOSIS — E119 Type 2 diabetes mellitus without complications: Secondary | ICD-10-CM | POA: Diagnosis not present

## 2017-02-13 MED ORDER — TRAZODONE HCL 50 MG PO TABS: 50.0000 mg | ORAL_TABLET | Freq: Every day | ORAL | 1 refills | Status: DC

## 2017-02-13 MED ORDER — TRAZODONE HCL 50 MG PO TABS: 50.0000 mg | ORAL_TABLET | Freq: Every day | ORAL | 3 refills | Status: DC

## 2017-02-13 MED ORDER — BUPROPION HCL ER (XL) 150 MG PO TB24: 150.0000 mg | ORAL_TABLET | Freq: Every morning | ORAL | 3 refills | Status: DC

## 2017-02-13 MED ORDER — PANCRELIPASE (LIP-PROT-AMYL) 36000-114000 UNITS PO CPEP: 36000.0000 [IU] | ORAL_CAPSULE | Freq: Three times a day (TID) | ORAL | 3 refills | Status: DC

## 2017-02-13 MED ORDER — TAMSULOSIN HCL 0.4 MG PO CAPS: 0.4000 mg | ORAL_CAPSULE | Freq: Every day | ORAL | 3 refills | Status: DC

## 2017-02-13 MED ORDER — LOSARTAN POTASSIUM 50 MG PO TABS: 50.0000 mg | ORAL_TABLET | Freq: Every day | ORAL | 3 refills | Status: DC

## 2017-02-13 MED ORDER — OMEPRAZOLE 40 MG PO CPDR: 40.0000 mg | DELAYED_RELEASE_CAPSULE | Freq: Every day | ORAL | 3 refills | Status: DC

## 2017-02-13 NOTE — Assessment & Plan Note (Signed)
Doing well 

## 2017-02-13 NOTE — Progress Notes (Signed)
Subjective:  Patient ID: Luke White, male    DOB: 12/23/1960  Age: 56 y.o. MRN: [id number]  CC: No chief complaint on file.   HPI Luke White presents for DM, pancreatitis, depression f/u   Outpatient Medications Prior to Visit  Medication Sig Dispense Refill  . acetaminophen (TYLENOL) 500 MG tablet Take 1,000 mg by mouth every 6 (six) hours as needed for moderate pain.    [name] buPROPion (WELLBUTRIN XL) 150 MG 24 hr tablet Take 1 tablet (150 mg total) by mouth every morning. 90 tablet 1  . cholecalciferol (VITAMIN D) 1000 UNITS tablet Take 1 tablet (1,000 Units total) by mouth daily.    [name] glucose blood (BAYER CONTOUR NEXT TEST) test strip 1 each by Other route 2 (two) times daily. Use to check blood sugars twice a day Dx E11.9 100 each 3  . ibuprofen (ADVIL,MOTRIN) 200 MG tablet Take 400 mg by mouth every 6 (six) hours as needed for moderate pain.    . Insulin Degludec (TRESIBA FLEXTOUCH) 100 UNIT/ML SOPN Inject 10 Units into the skin daily. (Patient taking differently: Inject 40 Units into the skin every morning. ) 9 mL 11  . Insulin Pen Needle 31G X 5 MM MISC 1 Units by Does not apply route 4 (four) times daily. 100 each 11  . lipase/protease/amylase (CREON) 36000 UNITS CPEP capsule Take 1 capsule (36,000 Units total) by mouth 3 (three) times daily before meals. 270 capsule 3  . losartan (COZAAR) 50 MG tablet Take 1 tablet (50 mg total) by mouth daily. 90 tablet 3  . NOVOLOG FLEXPEN 100 UNIT/ML FlexPen Inject 30 Units into the skin every evening. Before meals  2  . omeprazole (PRILOSEC) 40 MG capsule Take 1 capsule (40 mg total) by mouth daily. 90 capsule 3  . tamsulosin (FLOMAX) 0.4 MG CAPS capsule Take 1 capsule (0.4 mg total) by mouth daily. Annual appt is due must see provider for future refills 30 capsule 0  . traZODone (DESYREL) 50 MG tablet Take 1 tablet (50 mg total) by mouth at bedtime. 90 tablet 1   No facility-administered medications prior to visit.     ROS Review of  Systems  Constitutional: Negative for appetite change, fatigue and unexpected weight change.  HENT: Negative for congestion, nosebleeds, sneezing, sore throat and trouble swallowing.   Eyes: Negative for itching and visual disturbance.  Respiratory: Negative for cough.   Cardiovascular: Negative for chest pain, palpitations and leg swelling.  Gastrointestinal: Negative for abdominal distention, blood in stool, diarrhea and nausea.  Genitourinary: Negative for frequency and hematuria.  Musculoskeletal: Negative for back pain, gait problem, joint swelling and neck pain.  Skin: Negative for rash.  Neurological: Negative for dizziness, tremors, speech difficulty and weakness.  Psychiatric/Behavioral: Negative for agitation, dysphoric mood and sleep disturbance. The patient is not nervous/anxious.     Objective:  BP 122/76 (BP Location: Left Arm, Patient Position: Sitting, Cuff Size: Large)   Pulse 77   Temp 98.3 F (36.8 C) (Oral)   Ht 5\' 10"  (1.778 m)   Wt 185 lb (83.9 kg)   SpO2 99%   BMI 26.54 kg/m   BP Readings from Last 3 Encounters:  02/13/17 122/76  08/14/16 126/84  02/02/16 130/80    Wt Readings from Last 3 Encounters:  02/13/17 185 lb (83.9 kg)  08/14/16 180 lb 8 oz (81.9 kg)  02/02/16 162 lb (73.5 kg)    Physical Exam  Constitutional: He is oriented to person, place, and time. He  appears well-developed. No distress.  NAD  HENT:  Mouth/Throat: Oropharynx is clear and moist.  Eyes: Pupils are equal, round, and reactive to light. Conjunctivae are normal.  Neck: Normal range of motion. No JVD present. No thyromegaly present.  Cardiovascular: Normal rate, regular rhythm, normal heart sounds and intact distal pulses.  Exam reveals no gallop and no friction rub.   No murmur heard. Pulmonary/Chest: Effort normal and breath sounds normal. No respiratory distress. He has no wheezes. He has no rales. He exhibits no tenderness.  Abdominal: Soft. Bowel sounds are normal. He  exhibits no distension and no mass. There is no tenderness. There is no rebound and no guarding.  Musculoskeletal: Normal range of motion. He exhibits no edema or tenderness.  Lymphadenopathy:    He has no cervical adenopathy.  Neurological: He is alert and oriented to person, place, and time. He has normal reflexes. No cranial nerve deficit. He exhibits normal muscle tone. He displays a negative Romberg sign. Coordination and gait normal.  Skin: Skin is warm and dry. No rash noted.  Psychiatric: He has a normal mood and affect. His behavior is normal. Judgment and thought content normal.    Lab Results  Component Value Date   WBC 7.1 07/08/2015   HGB 13.7 07/08/2015   HCT 41.4 07/08/2015   PLT 100.0 (L) 07/08/2015   GLUCOSE 240 (H) 01/30/2017   CHOL 213 (H) 03/15/2014   TRIG 129.0 03/15/2014   HDL 53.60 03/15/2014   LDLDIRECT 149.4 01/12/2011   LDLCALC 134 (H) 03/15/2014   ALT 15 07/08/2015   AST 20 07/08/2015   NA 135 01/30/2017   K 4.3 01/30/2017   CL 101 01/30/2017   CREATININE 0.93 01/30/2017   BUN 17 01/30/2017   CO2 26 01/30/2017   TSH 2.21 03/15/2014   PSA 0.60 07/08/2015   INR 1.0 04/21/2015   HGBA1C 10.1 (H) 01/31/2016    Dg Wrist Complete Left  Result Date: 07/02/2015 CLINICAL DATA:  Status post reduction EXAM: LEFT WRIST - COMPLETE 2 VIEW COMPARISON:  Films from earlier in the same day FINDINGS: There remains a comminuted fracture with impaction and posterior angulation at the fracture site. Only minimal reduction is noted. IMPRESSION: Minimal reduction of the known distal radial fracture. Electronically Signed   By: Inez Catalina M.D.   On: 07/02/2015 20:52   Dg Wrist Complete Left  Result Date: 07/02/2015 CLINICAL DATA:  Fall while walking dog with wrist pain and deformity, initial encounter EXAM: LEFT WRIST - COMPLETE 3+ VIEW COMPARISON:  None. FINDINGS: Comminuted fracture of the distal left radius is noted with impaction and posterior angulation at the fracture  site. Fracture extends into the radiocarpal articulation. No other fractures are noted. IMPRESSION: Comminuted distal radial fracture as described. Electronically Signed   By: Inez Catalina M.D.   On: 07/02/2015 18:46    Assessment & Plan:   There are no diagnoses linked to this encounter. I am having Mr. Pichardo maintain his cholecalciferol, acetaminophen, ibuprofen, insulin degludec, Insulin Pen Needle, glucose blood, NOVOLOG FLEXPEN, omeprazole, losartan, buPROPion, lipase/protease/amylase, tamsulosin, traZODone, insulin lispro protamine-lispro, and Empagliflozin-Linagliptin.  Meds ordered this encounter  Medications  . insulin lispro protamine-lispro (HUMALOG 50/50 MIX) (50-50) 100 UNIT/ML SUSP injection    Sig: Inject into the skin 2 (two) times daily before a meal.  . Empagliflozin-Linagliptin (GLYXAMBI) 25-5 MG TABS    Sig: Take by mouth.     Follow-up: No Follow-up on file.  [name], MD

## 2017-02-13 NOTE — Assessment & Plan Note (Signed)
F/u w/Dr Dostou -- Endo Cont w/Insulin

## 2017-08-14 ENCOUNTER — Ambulatory Visit: Payer: BLUE CROSS/BLUE SHIELD | Admitting: Internal Medicine

## 2017-08-14 ENCOUNTER — Other Ambulatory Visit (INDEPENDENT_AMBULATORY_CARE_PROVIDER_SITE_OTHER): Payer: BLUE CROSS/BLUE SHIELD

## 2017-08-14 ENCOUNTER — Encounter: Payer: Self-pay | Admitting: Internal Medicine

## 2017-08-14 VITALS — BP 124/78 | HR 61 | Temp 98.2°F | Ht 70.0 in | Wt 183.0 lb

## 2017-08-14 DIAGNOSIS — F32A Depression, unspecified: Secondary | ICD-10-CM

## 2017-08-14 DIAGNOSIS — E119 Type 2 diabetes mellitus without complications: Secondary | ICD-10-CM | POA: Diagnosis not present

## 2017-08-14 DIAGNOSIS — I10 Essential (primary) hypertension: Secondary | ICD-10-CM

## 2017-08-14 DIAGNOSIS — N320 Bladder-neck obstruction: Secondary | ICD-10-CM

## 2017-08-14 DIAGNOSIS — Z23 Encounter for immunization: Secondary | ICD-10-CM | POA: Diagnosis not present

## 2017-08-14 DIAGNOSIS — F329 Major depressive disorder, single episode, unspecified: Secondary | ICD-10-CM | POA: Diagnosis not present

## 2017-08-14 DIAGNOSIS — F419 Anxiety disorder, unspecified: Secondary | ICD-10-CM

## 2017-08-14 LAB — MICROALBUMIN / CREATININE URINE RATIO
CREATININE, U: 28 mg/dL
MICROALB/CREAT RATIO: 2.5 mg/g (ref 0.0–30.0)
Microalb, Ur: <0.7 mg/dL (ref 0.0–1.9)

## 2017-08-14 LAB — URINALYSIS
Bilirubin Urine: NEGATIVE
Hgb urine dipstick: NEGATIVE
Ketones, ur: NEGATIVE
LEUKOCYTES UA: NEGATIVE
Nitrite: NEGATIVE
PH: 5.5 (ref 5.0–8.0)
Total Protein, Urine: NEGATIVE
Urine Glucose: >=1000 — AB
Urobilinogen, UA: 0.2 (ref 0.0–1.0)

## 2017-08-14 LAB — PSA: PSA: 0.47 ng/mL (ref 0.10–4.00)

## 2017-08-14 LAB — TSH: TSH: 2.17 u[IU]/mL (ref 0.35–4.50)

## 2017-08-14 MED ORDER — LOSARTAN POTASSIUM 50 MG PO TABS: 50.0000 mg | ORAL_TABLET | Freq: Every day | ORAL | 3 refills | Status: DC

## 2017-08-14 MED ORDER — TAMSULOSIN HCL 0.4 MG PO CAPS: 0.4000 mg | ORAL_CAPSULE | Freq: Every day | ORAL | 3 refills | Status: DC

## 2017-08-14 MED ORDER — TRAZODONE HCL 50 MG PO TABS: 50.0000 mg | ORAL_TABLET | Freq: Every day | ORAL | 3 refills | Status: DC

## 2017-08-14 MED ORDER — OMEPRAZOLE 40 MG PO CPDR: 40.0000 mg | DELAYED_RELEASE_CAPSULE | Freq: Every day | ORAL | 3 refills | Status: DC

## 2017-08-14 MED ORDER — BUPROPION HCL ER (XL) 150 MG PO TB24: 150.0000 mg | ORAL_TABLET | Freq: Every morning | ORAL | 3 refills | Status: DC

## 2017-08-14 MED ORDER — PANCRELIPASE (LIP-PROT-AMYL) 36000-114000 UNITS PO CPEP: 36000.0000 [IU] | ORAL_CAPSULE | Freq: Three times a day (TID) | ORAL | 3 refills | Status: DC

## 2017-08-14 NOTE — Assessment & Plan Note (Signed)
Trazodone  °

## 2017-08-14 NOTE — Assessment & Plan Note (Addendum)
PSA Flomax

## 2017-08-14 NOTE — Progress Notes (Signed)
Subjective:  Patient ID: Luke White, male    DOB: [birth year]  Age: 57 y.o. MRN: [id number]  CC: No chief complaint on file.   HPI Luke White presents for IDDM, depression, HTN Labs incl lipids/liver were done  Outpatient Medications Prior to Visit  Medication Sig Dispense Refill  . acetaminophen (TYLENOL) 500 MG tablet Take 1,000 mg by mouth every 6 (six) hours as needed for moderate pain.    [name] buPROPion (WELLBUTRIN XL) 150 MG 24 hr tablet Take 1 tablet (150 mg total) by mouth every morning. 90 tablet 3  . cholecalciferol (VITAMIN D) 1000 UNITS tablet Take 1 tablet (1,000 Units total) by mouth daily.    . Empagliflozin-Linagliptin (GLYXAMBI) 25-5 MG TABS Take by mouth.    [name] glucose blood (BAYER CONTOUR NEXT TEST) test strip 1 each by Other route 2 (two) times daily. Use to check blood sugars twice a day Dx E11.9 100 each 3  . ibuprofen (ADVIL,MOTRIN) 200 MG tablet Take 400 mg by mouth every 6 (six) hours as needed for moderate pain.    [name] insulin degludec (TRESIBA FLEXTOUCH) 100 UNIT/ML SOPN FlexTouch Pen INJECT 36 UNITS UNDER THE SKIN EVERY NIGHT AT BEDTIME AND ADJUST IF NEEDED UP TO 50 UNITS DAILY.    [name] insulin lispro protamine-lispro (HUMALOG 50/50 MIX) (50-50) 100 UNIT/ML SUSP injection Inject into the skin 2 (two) times daily before a meal.    . Insulin Pen Needle 31G X 5 MM MISC 1 Units by Does not apply route 4 (four) times daily. 100 each 11  . lipase/protease/amylase (CREON) 36000 UNITS CPEP capsule Take 1 capsule (36,000 Units total) by mouth 3 (three) times daily before meals. 270 capsule 3  . losartan (COZAAR) 50 MG tablet Take 1 tablet (50 mg total) by mouth daily. 90 tablet 3  . NOVOLOG FLEXPEN 100 UNIT/ML FlexPen Inject 26 Units into the skin 3 (three) times daily with meals. 8 am, 8 at lunch, 10 in the evening  2  . omeprazole (PRILOSEC) 40 MG capsule Take 1 capsule (40 mg total) by mouth daily. 90 capsule 3  . tamsulosin (FLOMAX) 0.4 MG CAPS capsule Take 1 capsule  (0.4 mg total) by mouth daily. Annual appt is due must see provider for future refills 90 capsule 3  . traZODone (DESYREL) 50 MG tablet Take 1 tablet (50 mg total) by mouth at bedtime. 90 tablet 3  . Insulin Degludec (TRESIBA FLEXTOUCH) 100 UNIT/ML SOPN Inject 10 Units into the skin daily. (Patient taking differently: Inject 40 Units into the skin every morning. ) 9 mL 11   No facility-administered medications prior to visit.     ROS Review of Systems  Constitutional: Negative for appetite change, fatigue and unexpected weight change.  HENT: Negative for congestion, nosebleeds, sneezing, sore throat and trouble swallowing.   Eyes: Negative for itching and visual disturbance.  Respiratory: Negative for cough.   Cardiovascular: Negative for chest pain, palpitations and leg swelling.  Gastrointestinal: Negative for abdominal distention, blood in stool, diarrhea and nausea.  Genitourinary: Negative for frequency and hematuria.  Musculoskeletal: Negative for back pain, gait problem, joint swelling and neck pain.  Skin: Negative for rash.  Neurological: Negative for dizziness, tremors, speech difficulty and weakness.  Psychiatric/Behavioral: Negative for agitation, dysphoric mood, sleep disturbance and suicidal ideas. The patient is not nervous/anxious.     Objective:  BP 124/78 (BP Location: Left Arm, Patient Position: Sitting, Cuff Size: Large)   Pulse 61   Temp 98.2 F (36.8 C) (  Oral)   Ht 5\' 10"  (1.778 m)   Wt 183 lb (83 kg)   SpO2 99%   BMI 26.26 kg/m   BP Readings from Last 3 Encounters:  08/14/17 124/78  02/13/17 122/76  08/14/16 126/84    Wt Readings from Last 3 Encounters:  08/14/17 183 lb (83 kg)  02/13/17 185 lb (83.9 kg)  08/14/16 180 lb 8 oz (81.9 kg)    Physical Exam  Constitutional: He is oriented to person, place, and time. He appears well-developed. No distress.  NAD  HENT:  Mouth/Throat: Oropharynx is clear and moist.  Eyes: Conjunctivae are normal.  Pupils are equal, round, and reactive to light.  Neck: Normal range of motion. No JVD present. No thyromegaly present.  Cardiovascular: Normal rate, regular rhythm, normal heart sounds and intact distal pulses. Exam reveals no gallop and no friction rub.  No murmur heard. Pulmonary/Chest: Effort normal and breath sounds normal. No respiratory distress. He has no wheezes. He has no rales. He exhibits no tenderness.  Abdominal: Soft. Bowel sounds are normal. He exhibits no distension and no mass. There is no tenderness. There is no rebound and no guarding.  Musculoskeletal: Normal range of motion. He exhibits no edema or tenderness.  Lymphadenopathy:    He has no cervical adenopathy.  Neurological: He is alert and oriented to person, place, and time. He has normal reflexes. No cranial nerve deficit. He exhibits normal muscle tone. He displays a negative Romberg sign. Coordination and gait normal.  Skin: Skin is warm and dry. No rash noted.  Psychiatric: He has a normal mood and affect. His behavior is normal. Judgment and thought content normal.    Lab Results  Component Value Date   WBC 7.1 07/08/2015   HGB 13.7 07/08/2015   HCT 41.4 07/08/2015   PLT 100.0 (L) 07/08/2015   GLUCOSE 240 (H) 01/30/2017   CHOL 213 (H) 03/15/2014   TRIG 129.0 03/15/2014   HDL 53.60 03/15/2014   LDLDIRECT 149.4 01/12/2011   LDLCALC 134 (H) 03/15/2014   ALT 15 07/08/2015   AST 20 07/08/2015   NA 135 01/30/2017   K 4.3 01/30/2017   CL 101 01/30/2017   CREATININE 0.93 01/30/2017   BUN 17 01/30/2017   CO2 26 01/30/2017   TSH 2.21 03/15/2014   PSA 0.60 07/08/2015   INR 1.0 04/21/2015   HGBA1C 10.1 (H) 01/31/2016    Dg Wrist Complete Left  Result Date: 07/02/2015 CLINICAL DATA:  Status post reduction EXAM: LEFT WRIST - COMPLETE 2 VIEW COMPARISON:  Films from earlier in the same day FINDINGS: There remains a comminuted fracture with impaction and posterior angulation at the fracture site. Only minimal  reduction is noted. IMPRESSION: Minimal reduction of the known distal radial fracture. Electronically Signed   By: Inez Catalina M.D.   On: 07/02/2015 20:52   Dg Wrist Complete Left  Result Date: 07/02/2015 CLINICAL DATA:  Fall while walking dog with wrist pain and deformity, initial encounter EXAM: LEFT WRIST - COMPLETE 3+ VIEW COMPARISON:  None. FINDINGS: Comminuted fracture of the distal left radius is noted with impaction and posterior angulation at the fracture site. Fracture extends into the radiocarpal articulation. No other fractures are noted. IMPRESSION: Comminuted distal radial fracture as described. Electronically Signed   By: Inez Catalina M.D.   On: 07/02/2015 18:46    Assessment & Plan:   There are no diagnoses linked to this encounter. I am having Ria Clock K. Moraes maintain his cholecalciferol, acetaminophen, ibuprofen, Insulin Pen Needle,  glucose blood, NOVOLOG FLEXPEN, insulin lispro protamine-lispro, Empagliflozin-Linagliptin, buPROPion, losartan, omeprazole, lipase/protease/amylase, tamsulosin, traZODone, and insulin degludec.  No orders of the defined types were placed in this encounter.    Follow-up: No Follow-up on file.  [name], MD

## 2017-08-14 NOTE — Patient Instructions (Signed)
Use [employer] Peroxicare tooth paste  

## 2017-08-14 NOTE — Assessment & Plan Note (Signed)
Tyler Aas and SS Novolog   Dr Sheppard Evens -- Endo

## 2017-08-14 NOTE — Assessment & Plan Note (Signed)
Losartan 

## 2017-08-14 NOTE — Addendum Note (Signed)
Addended by: Karren Cobble on: 08/14/2017 04:56 PM   Modules accepted: Orders

## 2017-08-15 ENCOUNTER — Other Ambulatory Visit: Payer: BLUE CROSS/BLUE SHIELD

## 2017-08-15 LAB — CBC WITH DIFFERENTIAL/PLATELET
BASOS PCT: 0.6 % (ref 0.0–3.0)
Basophils Absolute: 0 10*3/uL (ref 0.0–0.1)
Eosinophils Absolute: 0.2 10*3/uL (ref 0.0–0.7)
Eosinophils Relative: 2 % (ref 0.0–5.0)
HEMATOCRIT: 40.2 % (ref 39.0–52.0)
HEMOGLOBIN: 13.6 g/dL (ref 13.0–17.0)
Lymphocytes Relative: 25.8 % (ref 12.0–46.0)
Lymphs Abs: 2 10*3/uL (ref 0.7–4.0)
MCHC: 33.8 g/dL (ref 30.0–36.0)
MCV: 83.9 fl (ref 78.0–100.0)
MONO ABS: 0.6 10*3/uL (ref 0.1–1.0)
MONOS PCT: 7.7 % (ref 3.0–12.0)
Neutro Abs: 5 10*3/uL (ref 1.4–7.7)
Neutrophils Relative %: 63.9 % (ref 43.0–77.0)
Platelets: 183 10*3/uL (ref 150.0–400.0)
RBC: 4.79 Mil/uL (ref 4.22–5.81)
RDW: 14 % (ref 11.5–15.5)
WBC: 7.8 10*3/uL (ref 4.0–10.5)

## 2017-09-11 ENCOUNTER — Encounter: Payer: Self-pay | Admitting: Internal Medicine

## 2017-09-13 ENCOUNTER — Other Ambulatory Visit: Payer: Self-pay | Admitting: Internal Medicine

## 2017-09-13 MED ORDER — BUSPIRONE HCL 10 MG PO TABS: 10.0000 mg | ORAL_TABLET | Freq: Two times a day (BID) | ORAL | 3 refills | Status: DC | PRN

## 2017-09-20 LAB — HM DIABETES EYE EXAM

## 2017-10-17 ENCOUNTER — Encounter: Payer: Self-pay | Admitting: Internal Medicine

## 2018-01-06 ENCOUNTER — Encounter: Payer: Self-pay | Admitting: Internal Medicine

## 2018-01-07 MED ORDER — BUSPIRONE HCL 10 MG PO TABS: 10.0000 mg | ORAL_TABLET | Freq: Two times a day (BID) | ORAL | 0 refills | Status: DC | PRN

## 2018-02-11 ENCOUNTER — Ambulatory Visit: Payer: BLUE CROSS/BLUE SHIELD | Admitting: Internal Medicine

## 2018-02-11 ENCOUNTER — Encounter: Payer: Self-pay | Admitting: Internal Medicine

## 2018-02-11 ENCOUNTER — Other Ambulatory Visit (INDEPENDENT_AMBULATORY_CARE_PROVIDER_SITE_OTHER): Payer: BLUE CROSS/BLUE SHIELD

## 2018-02-11 DIAGNOSIS — F329 Major depressive disorder, single episode, unspecified: Secondary | ICD-10-CM

## 2018-02-11 DIAGNOSIS — R1084 Generalized abdominal pain: Secondary | ICD-10-CM

## 2018-02-11 DIAGNOSIS — I10 Essential (primary) hypertension: Secondary | ICD-10-CM

## 2018-02-11 DIAGNOSIS — K860 Alcohol-induced chronic pancreatitis: Secondary | ICD-10-CM | POA: Diagnosis not present

## 2018-02-11 DIAGNOSIS — F32A Depression, unspecified: Secondary | ICD-10-CM

## 2018-02-11 DIAGNOSIS — F419 Anxiety disorder, unspecified: Secondary | ICD-10-CM | POA: Diagnosis not present

## 2018-02-11 LAB — URINALYSIS
BILIRUBIN URINE: NEGATIVE
KETONES UR: NEGATIVE
LEUKOCYTES UA: NEGATIVE
Nitrite: NEGATIVE
Specific Gravity, Urine: 1.01 (ref 1.000–1.030)
TOTAL PROTEIN, URINE-UPE24: NEGATIVE
UROBILINOGEN UA: 0.2 (ref 0.0–1.0)
Urine Glucose: >=1000 — AB
pH: 6 (ref 5.0–8.0)

## 2018-02-11 LAB — CBC WITH DIFFERENTIAL/PLATELET
BASOS ABS: 0.1 10*3/uL (ref 0.0–0.1)
Basophils Relative: 0.7 % (ref 0.0–3.0)
EOS ABS: 0.1 10*3/uL (ref 0.0–0.7)
Eosinophils Relative: 0.9 % (ref 0.0–5.0)
HCT: 46.5 % (ref 39.0–52.0)
Hemoglobin: 15.4 g/dL (ref 13.0–17.0)
LYMPHS ABS: 2.1 10*3/uL (ref 0.7–4.0)
Lymphocytes Relative: 23.4 % (ref 12.0–46.0)
MCHC: 33.2 g/dL (ref 30.0–36.0)
MCV: 85.8 fl (ref 78.0–100.0)
MONOS PCT: 6.9 % (ref 3.0–12.0)
Monocytes Absolute: 0.6 10*3/uL (ref 0.1–1.0)
NEUTROS ABS: 6 10*3/uL (ref 1.4–7.7)
NEUTROS PCT: 68.1 % (ref 43.0–77.0)
PLATELETS: 202 10*3/uL (ref 150.0–400.0)
RBC: 5.42 Mil/uL (ref 4.22–5.81)
RDW: 13.8 % (ref 11.5–15.5)
WBC: 8.9 10*3/uL (ref 4.0–10.5)

## 2018-02-11 LAB — HEPATIC FUNCTION PANEL
ALBUMIN: 4.8 g/dL (ref 3.5–5.2)
ALK PHOS: 109 U/L (ref 39–117)
ALT: 19 U/L (ref 0–53)
AST: 21 U/L (ref 0–37)
Bilirubin, Direct: 0.1 mg/dL (ref 0.0–0.3)
TOTAL PROTEIN: 8.1 g/dL (ref 6.0–8.3)
Total Bilirubin: 0.4 mg/dL (ref 0.2–1.2)

## 2018-02-11 LAB — BASIC METABOLIC PANEL
BUN: 11 mg/dL (ref 6–23)
CHLORIDE: 102 meq/L (ref 96–112)
CO2: 29 meq/L (ref 19–32)
CREATININE: 0.98 mg/dL (ref 0.40–1.50)
Calcium: 9.9 mg/dL (ref 8.4–10.5)
GFR: 83.79 mL/min (ref >60.00)
Glucose, Bld: 154 mg/dL — ABNORMAL HIGH (ref 70–99)
POTASSIUM: 4 meq/L (ref 3.5–5.1)
SODIUM: 138 meq/L (ref 135–145)

## 2018-02-11 LAB — SEDIMENTATION RATE: Sed Rate: 16 mm/hr (ref 0–20)

## 2018-02-11 LAB — LIPASE: LIPASE: 36 U/L (ref 11.0–59.0)

## 2018-02-11 MED ORDER — BUSPIRONE HCL 15 MG PO TABS: 15.0000 mg | ORAL_TABLET | Freq: Three times a day (TID) | ORAL | 3 refills | Status: AC

## 2018-02-11 NOTE — Assessment & Plan Note (Signed)
?  flare up Labs May need another CT

## 2018-02-11 NOTE — Progress Notes (Signed)
Subjective:  Patient ID: Luke White, male    DOB: 05/26/1961  Age: 57 y.o. MRN: [id number]  CC: No chief complaint on file.   HPI Luke White presents for depression, anxiety C/o chills at night and night sweats, epig pain x 3 months C/o stress w/brother, mom  Outpatient Medications Prior to Visit  Medication Sig Dispense Refill  . acetaminophen (TYLENOL) 500 MG tablet Take 1,000 mg by mouth every 6 (six) hours as needed for moderate pain.    [name] buPROPion (WELLBUTRIN XL) 150 MG 24 hr tablet Take 1 tablet (150 mg total) by mouth every morning. 90 tablet 3  . busPIRone (BUSPAR) 10 MG tablet Take 1 tablet (10 mg total) by mouth 2 (two) times daily as needed. 180 tablet 0  . cholecalciferol (VITAMIN D) 1000 UNITS tablet Take 1 tablet (1,000 Units total) by mouth daily.    [name] glucose blood (BAYER CONTOUR NEXT TEST) test strip 1 each by Other route 2 (two) times daily. Use to check blood sugars twice a day Dx E11.9 100 each 3  . ibuprofen (ADVIL,MOTRIN) 200 MG tablet Take 400 mg by mouth every 6 (six) hours as needed for moderate pain.    [name] insulin degludec (TRESIBA FLEXTOUCH) 100 UNIT/ML SOPN FlexTouch Pen INJECT 36 UNITS UNDER THE SKIN EVERY NIGHT AT BEDTIME AND ADJUST IF NEEDED UP TO 50 UNITS DAILY.    [name] Insulin Pen Needle 31G X 5 MM MISC 1 Units by Does not apply route 4 (four) times daily. 100 each 11  . lipase/protease/amylase (CREON) 36000 UNITS CPEP capsule Take 1 capsule (36,000 Units total) by mouth 3 (three) times daily before meals. 270 capsule 3  . losartan (COZAAR) 50 MG tablet Take 1 tablet (50 mg total) by mouth daily. 90 tablet 3  . NOVOLOG FLEXPEN 100 UNIT/ML FlexPen Inject 26 Units into the skin 3 (three) times daily with meals. 8 am, 8 at lunch, 10 in the evening  2  . omeprazole (PRILOSEC) 40 MG capsule Take 1 capsule (40 mg total) by mouth daily. 90 capsule 3  . tamsulosin (FLOMAX) 0.4 MG CAPS capsule Take 1 capsule (0.4 mg total) by mouth daily. Annual appt is due  must see provider for future refills 90 capsule 3  . traZODone (DESYREL) 50 MG tablet Take 1 tablet (50 mg total) by mouth at bedtime. 90 tablet 3   No facility-administered medications prior to visit.     ROS: Review of Systems  Constitutional: Negative for appetite change, fatigue and unexpected weight change.  HENT: Negative for congestion, nosebleeds, sneezing, sore throat and trouble swallowing.   Eyes: Negative for itching and visual disturbance.  Respiratory: Negative for cough.   Cardiovascular: Negative for chest pain, palpitations and leg swelling.  Gastrointestinal: Negative for abdominal distention, blood in stool, diarrhea and nausea.  Genitourinary: Negative for frequency and hematuria.  Musculoskeletal: Negative for back pain, gait problem, joint swelling and neck pain.  Skin: Negative for rash.  Neurological: Negative for dizziness, tremors, speech difficulty and weakness.  Psychiatric/Behavioral: Negative for agitation, dysphoric mood and sleep disturbance. The patient is not nervous/anxious.     Objective:  BP 126/78 (BP Location: Left Arm, Patient Position: Sitting, Cuff Size: Normal)   Pulse 74   Temp 98.3 F (36.8 C) (Oral)   Ht 5\' 10"  (1.778 m)   Wt 178 lb (80.7 kg)   SpO2 97%   BMI 25.54 kg/m   BP Readings from Last 3 Encounters:  02/11/18 126/78  08/14/17  124/78  02/13/17 122/76    Wt Readings from Last 3 Encounters:  02/11/18 178 lb (80.7 kg)  08/14/17 183 lb (83 kg)  02/13/17 185 lb (83.9 kg)    Physical Exam  Constitutional: He is oriented to person, place, and time. He appears well-developed. No distress.  NAD  HENT:  Mouth/Throat: Oropharynx is clear and moist.  Eyes: Pupils are equal, round, and reactive to light. Conjunctivae are normal.  Neck: Normal range of motion. No JVD present. No thyromegaly present.  Cardiovascular: Normal rate, regular rhythm, normal heart sounds and intact distal pulses. Exam reveals no gallop and no friction  rub.  No murmur heard. Pulmonary/Chest: Effort normal and breath sounds normal. No respiratory distress. He has no wheezes. He has no rales. He exhibits no tenderness.  Abdominal: Soft. Bowel sounds are normal. He exhibits no distension and no mass. There is no tenderness. There is no rebound and no guarding.  Musculoskeletal: Normal range of motion. He exhibits no edema or tenderness.  Lymphadenopathy:    He has no cervical adenopathy.  Neurological: He is alert and oriented to person, place, and time. He has normal reflexes. No cranial nerve deficit. He exhibits normal muscle tone. He displays a negative Romberg sign. Coordination and gait normal.  Skin: Skin is warm and dry. No rash noted.  Psychiatric: He has a normal mood and affect. His behavior is normal. Judgment and thought content normal.    Lab Results  Component Value Date   WBC 7.8 08/14/2017   HGB 13.6 08/14/2017   HCT 40.2 08/14/2017   PLT 183.0 08/14/2017   GLUCOSE 240 (H) 01/30/2017   CHOL 213 (H) 03/15/2014   TRIG 129.0 03/15/2014   HDL 53.60 03/15/2014   LDLDIRECT 149.4 01/12/2011   LDLCALC 134 (H) 03/15/2014   ALT 15 07/08/2015   AST 20 07/08/2015   NA 135 01/30/2017   K 4.3 01/30/2017   CL 101 01/30/2017   CREATININE 0.93 01/30/2017   BUN 17 01/30/2017   CO2 26 01/30/2017   TSH 2.17 08/14/2017   PSA 0.47 08/14/2017   INR 1.0 04/21/2015   HGBA1C 10.1 (H) 01/31/2016   MICROALBUR <0.7 08/14/2017    Dg Wrist Complete Left  Result Date: 07/02/2015 CLINICAL DATA:  Status post reduction EXAM: LEFT WRIST - COMPLETE 2 VIEW COMPARISON:  Films from earlier in the same day FINDINGS: There remains a comminuted fracture with impaction and posterior angulation at the fracture site. Only minimal reduction is noted. IMPRESSION: Minimal reduction of the known distal radial fracture. Electronically Signed   By: Inez Catalina M.D.   On: 07/02/2015 20:52   Dg Wrist Complete Left  Result Date: 07/02/2015 CLINICAL DATA:  Fall  while walking dog with wrist pain and deformity, initial encounter EXAM: LEFT WRIST - COMPLETE 3+ VIEW COMPARISON:  None. FINDINGS: Comminuted fracture of the distal left radius is noted with impaction and posterior angulation at the fracture site. Fracture extends into the radiocarpal articulation. No other fractures are noted. IMPRESSION: Comminuted distal radial fracture as described. Electronically Signed   By: Inez Catalina M.D.   On: 07/02/2015 18:46    Assessment & Plan:   There are no diagnoses linked to this encounter.   No orders of the defined types were placed in this encounter.    Follow-up: No follow-ups on file.  [name], MD

## 2018-02-11 NOTE — Assessment & Plan Note (Signed)
Losartan 

## 2018-02-11 NOTE — Assessment & Plan Note (Signed)
Trazodone  °

## 2018-02-12 LAB — TSH: TSH: 2.12 u[IU]/mL (ref 0.35–4.50)

## 2018-03-13 ENCOUNTER — Other Ambulatory Visit: Payer: Self-pay

## 2018-03-13 MED ORDER — PANCRELIPASE (LIP-PROT-AMYL) 36000-114000 UNITS PO CPEP: 36000.0000 [IU] | ORAL_CAPSULE | Freq: Three times a day (TID) | ORAL | 3 refills | Status: AC

## 2018-03-14 ENCOUNTER — Telehealth: Payer: Self-pay | Admitting: Internal Medicine

## 2018-03-14 NOTE — Telephone Encounter (Signed)
Rx refilled by office 03/13/18- [facility]. Can not find Rx for Atorvastatin in medication list. Please review for Rx?

## 2018-03-14 NOTE — Telephone Encounter (Signed)
Copied from [place] [phone]. Topic: Quick Communication - Rx Refill/Question >> Mar 14, 2018 12:26 PM Reyne Dumas L wrote: Medication:  lipase/protease/amylase (CREON) 36000 UNITS CPEP capsule Atorvastatin 20mg  tablet  Has the patient contacted their pharmacy? Pharmacy calling.  Pt needs 90 day supply called in (Agent: If no, request that the patient contact the pharmacy for the refill.) (Agent: If yes, when and what did the pharmacy advise?)  Preferred Pharmacy (with phone number or street name): [facility], [place] [address] [place] ([address] [phone] (Phone) [phone] (Fax)  Agent: Please be advised that RX refills may take up to 3 business days. We ask that you follow-up with your pharmacy.

## 2018-03-17 NOTE — Telephone Encounter (Signed)
How much atorvastatin is he on? Thx

## 2018-03-17 NOTE — Telephone Encounter (Signed)
I cannot find atorvastatin in med list, please advise

## 2018-03-18 ENCOUNTER — Other Ambulatory Visit: Payer: Self-pay

## 2018-03-18 NOTE — Telephone Encounter (Signed)
[facility] refilled RX

## 2018-04-09 ENCOUNTER — Other Ambulatory Visit: Payer: Self-pay

## 2018-04-09 MED ORDER — OMEPRAZOLE 40 MG PO CPDR: 40.0000 mg | DELAYED_RELEASE_CAPSULE | Freq: Every day | ORAL | 3 refills | Status: AC

## 2018-05-27 ENCOUNTER — Encounter: Payer: Self-pay | Admitting: Internal Medicine

## 2018-05-27 ENCOUNTER — Ambulatory Visit: Payer: BLUE CROSS/BLUE SHIELD | Admitting: Internal Medicine

## 2018-05-27 DIAGNOSIS — F419 Anxiety disorder, unspecified: Secondary | ICD-10-CM

## 2018-05-27 DIAGNOSIS — F32A Depression, unspecified: Secondary | ICD-10-CM

## 2018-05-27 DIAGNOSIS — K860 Alcohol-induced chronic pancreatitis: Secondary | ICD-10-CM

## 2018-05-27 DIAGNOSIS — F329 Major depressive disorder, single episode, unspecified: Secondary | ICD-10-CM

## 2018-05-27 DIAGNOSIS — F418 Other specified anxiety disorders: Secondary | ICD-10-CM

## 2018-05-27 DIAGNOSIS — E119 Type 2 diabetes mellitus without complications: Secondary | ICD-10-CM

## 2018-05-27 NOTE — Assessment & Plan Note (Signed)
CT pain if abd pain is worse - probable pseudocyst

## 2018-05-27 NOTE — Progress Notes (Signed)
Subjective:  Patient ID: Luke White, male    DOB: April 25, 1961  Age: 57 y.o. MRN: [id number]  CC: No chief complaint on file.   HPI Luke White Record presents for DM, HTN, dyslipidemia, dementia f/u  Stress w/mom and two alcoholic brothers - discussed    Outpatient Medications Prior to Visit  Medication Sig Dispense Refill  . acetaminophen (TYLENOL) 500 MG tablet Take 1,000 mg by mouth every 6 (six) hours as needed for moderate pain.    [name] atorvastatin (LIPITOR) 20 MG tablet Take 20 mg by mouth daily.    [name] buPROPion (WELLBUTRIN XL) 150 MG 24 hr tablet Take 1 tablet (150 mg total) by mouth every morning. 90 tablet 3  . busPIRone (BUSPAR) 15 MG tablet Take 1 tablet (15 mg total) by mouth 3 (three) times daily. 270 tablet 3  . cholecalciferol (VITAMIN D) 1000 UNITS tablet Take 1 tablet (1,000 Units total) by mouth daily.    [name] glucose blood (BAYER CONTOUR NEXT TEST) test strip 1 each by Other route 2 (two) times daily. Use to check blood sugars twice a day Dx E11.9 100 each 3  . ibuprofen (ADVIL,MOTRIN) 200 MG tablet Take 400 mg by mouth every 6 (six) hours as needed for moderate pain.    [name] insulin degludec (TRESIBA FLEXTOUCH) 100 UNIT/ML SOPN FlexTouch Pen INJECT 36 UNITS UNDER THE SKIN EVERY NIGHT AT BEDTIME AND ADJUST IF NEEDED UP TO 50 UNITS DAILY.    [name] Insulin Pen Needle 31G X 5 MM MISC 1 Units by Does not apply route 4 (four) times daily. 100 each 11  . lipase/protease/amylase (CREON) 36000 UNITS CPEP capsule Take 1 capsule (36,000 Units total) by mouth 3 (three) times daily before meals. 270 capsule 3  . losartan (COZAAR) 50 MG tablet Take 1 tablet (50 mg total) by mouth daily. 90 tablet 3  . NOVOLOG FLEXPEN 100 UNIT/ML FlexPen Inject 26 Units into the skin 3 (three) times daily with meals. 8 am, 8 at lunch, 10 in the evening  2  . omeprazole (PRILOSEC) 40 MG capsule Take 1 capsule (40 mg total) by mouth daily. 90 capsule 3  . tamsulosin (FLOMAX) 0.4 MG CAPS capsule Take 1 capsule (0.4  mg total) by mouth daily. Annual appt is due must see provider for future refills 90 capsule 3  . traZODone (DESYREL) 50 MG tablet Take 1 tablet (50 mg total) by mouth at bedtime. 90 tablet 3   No facility-administered medications prior to visit.     ROS: Review of Systems  Constitutional: Negative for appetite change, fatigue and unexpected weight change.  HENT: Negative for congestion, nosebleeds, sneezing, sore throat and trouble swallowing.   Eyes: Negative for itching and visual disturbance.  Respiratory: Negative for cough.   Cardiovascular: Negative for chest pain, palpitations and leg swelling.  Gastrointestinal: Negative for abdominal distention, blood in stool, diarrhea and nausea.  Genitourinary: Negative for frequency and hematuria.  Musculoskeletal: Negative for back pain, gait problem, joint swelling and neck pain.  Skin: Negative for rash.  Neurological: Negative for dizziness, tremors, speech difficulty and weakness.  Psychiatric/Behavioral: Negative for agitation, dysphoric mood, sleep disturbance and suicidal ideas. The patient is nervous/anxious.     Objective:  BP 130/78 (BP Location: Left Arm, Patient Position: Sitting, Cuff Size: Normal)   Pulse 81   Temp 97.7 F (36.5 C) (Oral)   Ht 5\' 10"  (1.778 m)   Wt 178 lb (80.7 kg)   SpO2 98%   BMI 25.54 kg/m  BP Readings from Last 3 Encounters:  05/27/18 130/78  02/11/18 126/78  08/14/17 124/78    Wt Readings from Last 3 Encounters:  05/27/18 178 lb (80.7 kg)  02/11/18 178 lb (80.7 kg)  08/14/17 183 lb (83 kg)    Physical Exam  Constitutional: He is oriented to person, place, and time. He appears well-developed. No distress.  NAD  HENT:  Mouth/Throat: Oropharynx is clear and moist.  Eyes: Pupils are equal, round, and reactive to light. Conjunctivae are normal.  Neck: Normal range of motion. No JVD present. No thyromegaly present.  Cardiovascular: Normal rate, regular rhythm, normal heart sounds and  intact distal pulses. Exam reveals no gallop and no friction rub.  No murmur heard. Pulmonary/Chest: Effort normal and breath sounds normal. No respiratory distress. He has no wheezes. He has no rales. He exhibits no tenderness.  Abdominal: Soft. Bowel sounds are normal. He exhibits no distension and no mass. There is no tenderness. There is no rebound and no guarding.  Musculoskeletal: Normal range of motion. He exhibits no edema or tenderness.  Lymphadenopathy:    He has no cervical adenopathy.  Neurological: He is alert and oriented to person, place, and time. He has normal reflexes. No cranial nerve deficit. He exhibits normal muscle tone. He displays a negative Romberg sign. Coordination and gait normal.  Skin: Skin is warm and dry. No rash noted.  Psychiatric: He has a normal mood and affect. His behavior is normal. Judgment and thought content normal.    Lab Results  Component Value Date   WBC 8.9 02/11/2018   HGB 15.4 02/11/2018   HCT 46.5 02/11/2018   PLT 202.0 02/11/2018   GLUCOSE 154 (H) 02/11/2018   CHOL 213 (H) 03/15/2014   TRIG 129.0 03/15/2014   HDL 53.60 03/15/2014   LDLDIRECT 149.4 01/12/2011   LDLCALC 134 (H) 03/15/2014   ALT 19 02/11/2018   AST 21 02/11/2018   NA 138 02/11/2018   K 4.0 02/11/2018   CL 102 02/11/2018   CREATININE 0.98 02/11/2018   BUN 11 02/11/2018   CO2 29 02/11/2018   TSH 2.12 02/11/2018   PSA 0.47 08/14/2017   INR 1.0 04/21/2015   HGBA1C 10.1 (H) 01/31/2016   MICROALBUR <0.7 08/14/2017    Dg Wrist Complete Left  Result Date: 07/02/2015 CLINICAL DATA:  Status post reduction EXAM: LEFT WRIST - COMPLETE 2 VIEW COMPARISON:  Films from earlier in the same day FINDINGS: There remains a comminuted fracture with impaction and posterior angulation at the fracture site. Only minimal reduction is noted. IMPRESSION: Minimal reduction of the known distal radial fracture. Electronically Signed   By: Inez Catalina M.D.   On: 07/02/2015 20:52   Dg Wrist  Complete Left  Result Date: 07/02/2015 CLINICAL DATA:  Fall while walking dog with wrist pain and deformity, initial encounter EXAM: LEFT WRIST - COMPLETE 3+ VIEW COMPARISON:  None. FINDINGS: Comminuted fracture of the distal left radius is noted with impaction and posterior angulation at the fracture site. Fracture extends into the radiocarpal articulation. No other fractures are noted. IMPRESSION: Comminuted distal radial fracture as described. Electronically Signed   By: Inez Catalina M.D.   On: 07/02/2015 18:46    Assessment & Plan:   There are no diagnoses linked to this encounter.   No orders of the defined types were placed in this encounter.    Follow-up: No follow-ups on file.  [name], MD

## 2018-05-27 NOTE — Assessment & Plan Note (Signed)
CT calcium scoring test was offered 12/19

## 2018-05-27 NOTE — Patient Instructions (Signed)

## 2018-05-27 NOTE — Assessment & Plan Note (Signed)
Discussed - two alcoholic brothers

## 2018-05-27 NOTE — Assessment & Plan Note (Signed)
Stress w/mom and two alcoholic brothers - discussed

## 2018-08-18 ENCOUNTER — Other Ambulatory Visit: Payer: Self-pay | Admitting: Internal Medicine

## 2018-08-18 ENCOUNTER — Other Ambulatory Visit: Payer: Self-pay

## 2018-08-18 MED ORDER — LOSARTAN POTASSIUM 50 MG PO TABS: 50.0000 mg | ORAL_TABLET | Freq: Every day | ORAL | 3 refills | Status: AC

## 2018-09-15 ENCOUNTER — Other Ambulatory Visit: Payer: Self-pay | Admitting: Internal Medicine

## 2018-09-16 NOTE — Telephone Encounter (Signed)
You have not prescribed atorvastatin before, please advise.  tamsulosin filled

## 2018-09-18 MED ORDER — ATORVASTATIN CALCIUM 20 MG PO TABS: 20.0000 mg | ORAL_TABLET | Freq: Every day | ORAL | 3 refills | Status: AC

## 2018-10-01 ENCOUNTER — Ambulatory Visit (INDEPENDENT_AMBULATORY_CARE_PROVIDER_SITE_OTHER): Payer: BLUE CROSS/BLUE SHIELD | Admitting: Internal Medicine

## 2018-10-01 ENCOUNTER — Encounter: Payer: Self-pay | Admitting: Internal Medicine

## 2018-10-01 DIAGNOSIS — E119 Type 2 diabetes mellitus without complications: Secondary | ICD-10-CM

## 2018-10-01 DIAGNOSIS — K860 Alcohol-induced chronic pancreatitis: Secondary | ICD-10-CM

## 2018-10-01 DIAGNOSIS — I10 Essential (primary) hypertension: Secondary | ICD-10-CM

## 2018-10-01 NOTE — Assessment & Plan Note (Signed)
Tyler Aas and [employer]

## 2018-10-01 NOTE — Progress Notes (Signed)
Virtual Visit via Telephone Note  I connected with Dolph Tavano Mcnab on 10/01/18 at  2:00 PM EDT by telephone and verified that I am speaking with the correct person using two identifiers.   I discussed the limitations, risks, security and privacy concerns of performing an evaluation and management service by telephone and the availability of in person appointments. I also discussed with the patient that there may be a patient responsible charge related to this service. The patient expressed understanding and agreed to proceed.   History of Present Illness:   F/u DM, HTN, chronic pancreatitis. CBGs are good Observations/Objective:  NAD Looks well Assessment and Plan:  COVID19 issues discussed See plan Follow Up Instructions:    I discussed the assessment and treatment plan with the patient. The patient was provided an opportunity to ask questions and all were answered. The patient agreed with the plan and demonstrated an understanding of the instructions.   The patient was advised to call back or seek an in-person evaluation if the symptoms worsen or if the condition fails to improve as anticipated.  I provided 20 minutes of non-face-to-face time during this encounter.   [name], MD

## 2018-10-01 NOTE — Assessment & Plan Note (Signed)
Losartan 

## 2018-10-01 NOTE — Assessment & Plan Note (Signed)
No pains

## 2018-10-13 ENCOUNTER — Other Ambulatory Visit: Payer: Self-pay | Admitting: Internal Medicine

## 2018-10-24 ENCOUNTER — Other Ambulatory Visit: Payer: Self-pay | Admitting: Internal Medicine

## 2019-07-23 ENCOUNTER — Ambulatory Visit: Payer: Self-pay | Attending: Internal Medicine

## 2019-07-23 DIAGNOSIS — Z20822 Contact with and (suspected) exposure to covid-19: Secondary | ICD-10-CM | POA: Insufficient documentation

## 2019-07-24 LAB — NOVEL CORONAVIRUS, NAA: SARS-CoV-2, NAA: NOT DETECTED

## 2020-06-07 ENCOUNTER — Other Ambulatory Visit: Payer: Self-pay

## 2020-06-07 ENCOUNTER — Encounter: Payer: Self-pay | Admitting: Physician Assistant

## 2020-06-07 ENCOUNTER — Ambulatory Visit (INDEPENDENT_AMBULATORY_CARE_PROVIDER_SITE_OTHER): Payer: Self-pay | Admitting: Physician Assistant

## 2020-06-07 DIAGNOSIS — Z86007 Personal history of in-situ neoplasm of skin: Secondary | ICD-10-CM

## 2020-06-07 DIAGNOSIS — L308 Other specified dermatitis: Secondary | ICD-10-CM

## 2020-06-07 DIAGNOSIS — L578 Other skin changes due to chronic exposure to nonionizing radiation: Secondary | ICD-10-CM

## 2020-06-07 MED ORDER — TRIAMCINOLONE ACETONIDE 0.1 % EX CREA: 1.0000 "application " | TOPICAL_CREAM | Freq: Every day | CUTANEOUS | 1 refills | Status: AC

## 2020-06-07 MED ORDER — TOLAK 4 % EX CREA: 1.0000 "application " | TOPICAL_CREAM | Freq: Every evening | CUTANEOUS | 0 refills | Status: AC

## 2020-06-07 NOTE — Patient Instructions (Signed)
[place] prescription sent to [facility]. If you haven't heard from them by the end of the day today then give them a call @ [phone].

## 2020-06-07 NOTE — Progress Notes (Addendum)
   New Patient   Subjective  Luke White is a 59 y.o. male who presents for the following: New Patient (Initial Visit) (Patient here today for redness on right sideburn x 3 years. Per patient it does itch sometimes, patient seen a Dermatologist at the [place] and they gave him Ketoconazole and 2.5% Hytone to use. Per patient it has helped.).   The following portions of the chart were reviewed this encounter and updated as appropriate:  Tobacco  Allergies  Meds  Problems  Med Hx  Surg Hx  Fam Hx      Objective  Well appearing patient in no apparent distress; mood and affect are within normal limits.  A focused examination was performed including face and hands. Relevant physical exam findings are noted in the Assessment and Plan.  Objective  Right Cheek: 06/22No signs of non-mole skin cancer. 2017 [facility]IS Right Cheek  Objective  right temple: No signs of non-mole skin cancer. Diffuse pink scale   Objective  Both Dorsal Hand: Ill-defined pink papules.  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 The documentation on 06/07/20 for the exam, diagnosis, procedures, and orders are all accurate and complete.

## 2020-06-29 ENCOUNTER — Telehealth: Payer: Self-pay | Admitting: *Deleted

## 2020-06-29 DIAGNOSIS — L308 Other specified dermatitis: Secondary | ICD-10-CM

## 2020-06-29 MED ORDER — CEPHALEXIN 500 MG PO CAPS: 500.0000 mg | ORAL_CAPSULE | Freq: Two times a day (BID) | ORAL | 0 refills | Status: AC

## 2020-06-29 NOTE — Telephone Encounter (Signed)
Patient contacted Luke White- severe reaction to Tolak/ see photos. Patient wanting to know what to do next.  Per [facility], Keflex x 10 days.  Patient notified.

## 2020-08-11 ENCOUNTER — Ambulatory Visit: Payer: Self-pay | Admitting: Physician Assistant

## 2020-11-08 ENCOUNTER — Ambulatory Visit: Payer: Self-pay | Admitting: Physician Assistant
